# Patient Record
Sex: Male | Born: 1970 | Race: Black or African American | Hispanic: No | Marital: Single | Smoking: Current every day smoker
Health system: Southern US, Community
[De-identification: ages and names within clinical notes are randomized; demographics above are authoritative.]

## PROBLEM LIST (undated history)

## (undated) DIAGNOSIS — I428 Other cardiomyopathies: Secondary | ICD-10-CM

## (undated) DIAGNOSIS — I5022 Chronic systolic (congestive) heart failure: Secondary | ICD-10-CM

## (undated) DIAGNOSIS — I10 Essential (primary) hypertension: Secondary | ICD-10-CM

## (undated) DIAGNOSIS — G35 Multiple sclerosis: Secondary | ICD-10-CM

## (undated) HISTORY — DX: Other cardiomyopathies: I42.8

## (undated) HISTORY — PX: GALLBLADDER SURGERY: SHX652

## (undated) HISTORY — DX: Chronic systolic (congestive) heart failure: I50.22

---

## 2011-06-18 ENCOUNTER — Emergency Department (HOSPITAL_COMMUNITY): Payer: Self-pay

## 2011-06-18 ENCOUNTER — Encounter (HOSPITAL_COMMUNITY): Payer: Self-pay

## 2011-06-18 ENCOUNTER — Emergency Department (HOSPITAL_COMMUNITY)
Admission: EM | Admit: 2011-06-18 | Discharge: 2011-06-18 | Disposition: A | Discharge status: Home / Self Care | Payer: Self-pay | Attending: Emergency Medicine | Admitting: Emergency Medicine

## 2011-06-18 DIAGNOSIS — R51 Headache: Secondary | ICD-10-CM | POA: Insufficient documentation

## 2011-06-18 DIAGNOSIS — I10 Essential (primary) hypertension: Secondary | ICD-10-CM | POA: Insufficient documentation

## 2011-06-18 HISTORY — DX: Essential (primary) hypertension: I10

## 2011-06-18 LAB — DIFFERENTIAL
Basophils Absolute: 0 10*3/uL (ref 0.0–0.1)
Lymphocytes Relative: 14 % (ref 12–46)
Monocytes Absolute: 0.6 10*3/uL (ref 0.1–1.0)
Monocytes Relative: 5 % (ref 3–12)
Neutro Abs: 9.4 10*3/uL — ABNORMAL HIGH (ref 1.7–7.7)
Neutrophils Relative %: 81 % — ABNORMAL HIGH (ref 43–77)

## 2011-06-18 LAB — BASIC METABOLIC PANEL
BUN: 6 mg/dL (ref 6–23)
CO2: 27 mEq/L (ref 19–32)
Chloride: 98 mEq/L (ref 96–112)
Creatinine, Ser: 1.1 mg/dL (ref 0.50–1.35)
GFR calc Af Amer: >90 mL/min (ref >90)
Potassium: 3.6 mEq/L (ref 3.5–5.1)

## 2011-06-18 LAB — CBC
HCT: 46.2 % (ref 39.0–52.0)
Hemoglobin: 15.1 g/dL (ref 13.0–17.0)
WBC: 11.7 10*3/uL — ABNORMAL HIGH (ref 4.0–10.5)

## 2011-06-18 LAB — SEDIMENTATION RATE: Sed Rate: 1 mm/hr (ref 0–16)

## 2011-06-18 MED ORDER — DEXAMETHASONE SODIUM PHOSPHATE 10 MG/ML IJ SOLN
10.0000 mg | Freq: Once | INTRAMUSCULAR | Status: AC
  Administered 2011-06-18: 10 mg via INTRAVENOUS
  Filled 2011-06-18: qty 1

## 2011-06-18 MED ORDER — METOCLOPRAMIDE HCL 5 MG/ML IJ SOLN
10.0000 mg | Freq: Once | INTRAMUSCULAR | Status: AC
  Administered 2011-06-18: 10 mg via INTRAVENOUS
  Filled 2011-06-18: qty 2

## 2011-06-18 MED ORDER — KETOROLAC TROMETHAMINE 30 MG/ML IJ SOLN
30.0000 mg | Freq: Once | INTRAMUSCULAR | Status: AC
  Administered 2011-06-18: 30 mg via INTRAVENOUS
  Filled 2011-06-18: qty 1

## 2011-06-18 MED ORDER — FENTANYL CITRATE 0.05 MG/ML IJ SOLN
50.0000 ug | Freq: Once | INTRAMUSCULAR | Status: AC
  Administered 2011-06-18: 50 ug via INTRAVENOUS

## 2011-06-18 MED ORDER — SODIUM CHLORIDE 0.9 % IV SOLN
Freq: Once | INTRAVENOUS | Status: AC
  Administered 2011-06-18: 1000 mL via INTRAVENOUS

## 2011-06-18 MED ORDER — PROCHLORPERAZINE EDISYLATE 5 MG/ML IJ SOLN
10.0000 mg | Freq: Once | INTRAMUSCULAR | Status: AC
  Administered 2011-06-18: 10 mg via INTRAVENOUS
  Filled 2011-06-18: qty 2

## 2011-06-18 MED ORDER — DIPHENHYDRAMINE HCL 50 MG/ML IJ SOLN
12.5000 mg | Freq: Once | INTRAMUSCULAR | Status: AC
  Administered 2011-06-18: 12.5 mg via INTRAVENOUS
  Filled 2011-06-18: qty 1

## 2011-06-18 MED ORDER — HYDROCODONE-ACETAMINOPHEN 5-325 MG PO TABS: 1.0000 | ORAL_TABLET | Freq: Four times a day (QID) | ORAL | Status: DC | PRN

## 2011-06-18 MED ORDER — SODIUM CHLORIDE 0.9 % IV BOLUS (SEPSIS)
1000.0000 mL | Freq: Once | INTRAVENOUS | Status: AC
  Administered 2011-06-18: 1000 mL via INTRAVENOUS

## 2011-06-18 MED ORDER — FENTANYL CITRATE 0.05 MG/ML IJ SOLN
50.0000 ug | Freq: Once | INTRAMUSCULAR | Status: AC
  Administered 2011-06-18: 50 ug via INTRAVENOUS
  Filled 2011-06-18: qty 2

## 2011-06-18 MED ORDER — MAGNESIUM SULFATE 40 MG/ML IJ SOLN
2.0000 g | INTRAMUSCULAR | Status: AC
  Administered 2011-06-18: 2 g via INTRAVENOUS
  Filled 2011-06-18: qty 50

## 2011-06-18 MED ORDER — DICLOFENAC SODIUM 75 MG PO TBEC: 75.0000 mg | DELAYED_RELEASE_TABLET | Freq: Two times a day (BID) | ORAL | Status: AC

## 2011-06-18 MED ORDER — DIPHENHYDRAMINE HCL 50 MG/ML IJ SOLN
25.0000 mg | Freq: Once | INTRAMUSCULAR | Status: AC
  Administered 2011-06-18: 25 mg via INTRAVENOUS
  Filled 2011-06-18: qty 1

## 2011-06-18 NOTE — ED Notes (Signed)
Complains of pain in front of head.

## 2011-06-18 NOTE — ED Provider Notes (Signed)
History     CSN: [id number]  Arrival date & time 06/18/11  9562   First MD Initiated Contact with Patient 06/18/11 1013      11:00 AM  HPI Patient reports a headache that began yesterday. States headache has been gradually worsening. Reports pain is in the right temporal side and seems to radiate down entire right head. Describes pain as sharp and constant. Denies history of headaches or migraines in the past. Denies nausea, vomiting, weakness, aphasia, ataxia, neck pain, fever. Denies history of Zoster. Patient is a 41 y.o. male presenting with headaches. The history is provided by the patient.  Headache  This is a new problem. The current episode started yesterday. The problem occurs constantly. The problem has been gradually worsening. The pain is located in the right unilateral and temporal region. The quality of the pain is described as sharp. The pain is severe. The pain radiates to the face. Pertinent negatives include no fever, no shortness of breath, no nausea and no vomiting. He has tried nothing for the symptoms.    Past Medical History  Diagnosis Date  . Hypertension     History reviewed. No pertinent past surgical history.  History reviewed. No pertinent family history.  History  Substance Use Topics  . Smoking status: Never Smoker   . Smokeless tobacco: Not on file  . Alcohol Use: Yes      Review of Systems  Constitutional: Negative for fever, chills and diaphoresis.  HENT: Negative for ear pain, congestion, sore throat, rhinorrhea, neck pain, neck stiffness and sinus pressure.   Respiratory: Negative for shortness of breath.   Cardiovascular: Negative for chest pain.  Gastrointestinal: Negative for nausea and vomiting.  Neurological: Positive for headaches. Negative for dizziness, seizures, syncope, facial asymmetry, speech difficulty, weakness, light-headedness and numbness.  All other systems reviewed and are negative.    Allergies  Review of patient's  allergies indicates no known allergies.  Home Medications   Current Outpatient Rx  Name Route Sig Dispense Refill  . ACETAMINOPHEN 325 MG PO TABS Oral Take 650 mg by mouth every 6 (six) hours as needed. For pain.    . ASPIRIN 325 MG PO TABS Oral Take 325 mg by mouth daily.    Marland Kitchen HYDROCHLOROTHIAZIDE 12.5 MG PO CAPS Oral Take 12.5 mg by mouth daily.    Marland Kitchen LISINOPRIL 10 MG PO TABS Oral Take 10 mg by mouth daily.      BP 148/80  Pulse 70  Temp(Src) 99.3 F (37.4 C) (Oral)  Resp 20  SpO2 97%  Physical Exam  Constitutional: He is oriented to person, place, and time. He appears well-developed and well-nourished.  HENT:  Head: Normocephalic and atraumatic.  Right Ear: External ear normal.  Left Ear: External ear normal.  Nose: Nose normal.  Mouth/Throat: Oropharynx is clear and moist. No oropharyngeal exudate.  Eyes: Conjunctivae and EOM are normal. Pupils are equal, round, and reactive to light. Left eye exhibits no discharge.  Neck: Normal range of motion and full passive range of motion without pain. Neck supple. No spinous process tenderness and no muscular tenderness present. No rigidity. Normal range of motion present. No Brudzinski's sign and no Kernig's sign noted.  Cardiovascular: Normal rate, regular rhythm and normal heart sounds.   Pulmonary/Chest: Effort normal and breath sounds normal.  Abdominal: Soft. Bowel sounds are normal.  Lymphadenopathy:    He has no cervical adenopathy.  Neurological: He is alert and oriented to person, place, and time. He has normal strength.  No cranial nerve deficit. Coordination normal.  Skin: Skin is warm and dry. No rash noted. No erythema. No pallor.  Psychiatric: He has a normal mood and affect. His behavior is normal.    ED Course  Procedures   Results for orders placed during the hospital encounter of 06/18/11  CBC      Component Value Range   WBC 11.7 (*) 4.0 - 10.5 (K/uL)   RBC 5.68  4.22 - 5.81 (MIL/uL)   Hemoglobin 15.1  13.0 -  17.0 (g/dL)   HCT 16.1  09.6 - 04.5 (%)   MCV 81.3  78.0 - 100.0 (fL)   MCH 26.6  26.0 - 34.0 (pg)   MCHC 32.7  30.0 - 36.0 (g/dL)   RDW 40.9  81.1 - 91.4 (%)   Platelets 283  150 - 400 (K/uL)  DIFFERENTIAL      Component Value Range   Neutrophils Relative 81 (*) 43 - 77 (%)   Neutro Abs 9.4 (*) 1.7 - 7.7 (K/uL)   Lymphocytes Relative 14  12 - 46 (%)   Lymphs Abs 1.6  0.7 - 4.0 (K/uL)   Monocytes Relative 5  3 - 12 (%)   Monocytes Absolute 0.6  0.1 - 1.0 (K/uL)   Eosinophils Relative 0  0 - 5 (%)   Eosinophils Absolute 0.0  0.0 - 0.7 (K/uL)   Basophils Relative 0  0 - 1 (%)   Basophils Absolute 0.0  0.0 - 0.1 (K/uL)  BASIC METABOLIC PANEL      Component Value Range   Sodium 137  135 - 145 (mEq/L)   Potassium 3.6  3.5 - 5.1 (mEq/L)   Chloride 98  96 - 112 (mEq/L)   CO2 27  19 - 32 (mEq/L)   Glucose, Bld 83  70 - 99 (mg/dL)   BUN 6  6 - 23 (mg/dL)   Creatinine, Ser 7.82  0.50 - 1.35 (mg/dL)   Calcium 9.7  8.4 - 95.6 (mg/dL)   GFR calc non Af Amer 83 (*) >90 (mL/min)   GFR calc Af Amer >90  >90 (mL/min)  SEDIMENTATION RATE      Component Value Range   Sed Rate 1  0 - 16 (mm/hr)   Ct Head Wo Contrast  06/18/2011  *RADIOLOGY REPORT*  Clinical Data: Headache, dizziness.  CT HEAD WITHOUT CONTRAST  Technique:  Contiguous axial images were obtained from the base of the skull through the vertex without contrast.  Comparison: None  Findings: No acute intracranial abnormality.  Specifically, no hemorrhage, hydrocephalus, mass lesion, acute infarction, or significant intracranial injury.  No acute calvarial abnormality. Visualized paranasal sinuses and mastoids clear.  Orbital soft tissues unremarkable.  IMPRESSION: No intracranial abnormality.  Original Report Authenticated By: Cyndie Chime, M.D.     MDM   2:01 PM Labs and imaging WNL. Will give fentanyl for pain and d/c with vicodin and diclofenac. Advised immediate return to ED if he develops a facial rash, fever or neck pain.  Patient given description of zoster. Do not feel it is necessary to start on antivirals yet unless rash appears. Pt voices understanding and is ready for d/c    Thomasene Lot, PA-C 06/18/11 1404   As we were d/cing patient. Pt states HA is worsening. Is now a 7/10. Spouse states "he has a high pain tolerance. Advised will will try to control pain better in CDU.   Thomasene Lot, PA-C 06/18/11 1431

## 2011-06-18 NOTE — ED Notes (Signed)
PA at bedside.

## 2011-06-18 NOTE — ED Notes (Addendum)
Patient states he is continuing to have headache 8 of 10 when he his sitting up in bed.

## 2011-06-18 NOTE — ED Notes (Signed)
Patient continues to have headache. Patient states he has had a headache since 1230 pm yesterday. Lunch tray ordered and patient given a urinal.

## 2011-06-18 NOTE — ED Notes (Signed)
Patient states as long as he is lying down his headache pain is 1-2 of 10 and when he sits up his headache pain is 5 of 10.

## 2011-06-18 NOTE — Discharge Instructions (Signed)
General Headache, Without Cause A general headache has no specific cause. These headaches are not life-threatening. They will not lead to other types of headaches. HOME CARE   Make and keep follow-up visits with your doctor.   Only take medicine as told by your doctor.   Try to relax, get a massage, or use your thoughts to control your body (biofeedback).   Apply cold or heat to the head and neck. Apply 3 or 4 times a day or as needed.  Finding out the results of your test Ask when your test results will be ready. Make sure you get your test results. GET HELP RIGHT AWAY IF:   You have problems with medicine.   Your medicine does not help relieve pain.   Your headache changes or becomes worse.   You feel sick to your stomach (nauseous) or throw up (vomit).   You have a temperature by mouth above 102 F (38.9 C), not controlled by medicine.   Your have a stiff neck.   You have vision loss.   You have muscle weakness.   You lose control of your muscles.   You lose balance or have trouble walking.   You feel like you are going to pass out (faint).  MAKE SURE YOU:   Understand these instructions.   Will watch this condition.   Will get help right away if you are not doing well or get worse.  Document Released: 11/28/2007 Document Revised: 02/07/2011 Document Reviewed: 11/28/2007 [employer] Patient Information 2012 [employer]. 

## 2011-06-18 NOTE — ED Provider Notes (Signed)
5:00 PM Signout from [place], [place]. Pt presents with frontal HA. No neuro sx, CT and testing nl. Pt was treated with several pain medications and was feeling better. They were preparing to discharge the patient when he had recurrent headache. I've ordered additional cocktail of compazine, benadryl, and magnesium. No neuro deficits seen on exam. Heart with regular rate and rhythm, lungs clear to auscultation bilaterally , abdomen soft and nontender.  6:45 PM Patient is currently sleeping. His significant other, at the bedside states that he has been "in and out," but has stated that his headache has persisted. Will allow him to sleep and recheck later.  8:00 PM Patient stated he was feeling significantly better at this time. He feels stable to be discharged home. He was given the prescriptions which were written at his initial discharge. Return precautions discussed.  [place], [place] 06/19/11 [phone]

## 2011-06-19 NOTE — ED Provider Notes (Signed)
Medical screening examination/treatment/procedure(s) were performed by non-physician practitioner and as supervising physician I was immediately available for consultation/collaboration.   Lakeyshia Tuckerman, MD 06/19/11 0108 

## 2011-06-21 NOTE — ED Provider Notes (Signed)
Medical screening examination/treatment/procedure(s) were performed by non-physician practitioner and as supervising physician I was immediately available for consultation/collaboration.  Headache without neuro deficits or stiff neck  Donnetta Hutching, MD 06/21/11 1419

## 2012-01-16 ENCOUNTER — Emergency Department (HOSPITAL_COMMUNITY)
Admission: EM | Admit: 2012-01-16 | Discharge: 2012-01-16 | Disposition: A | Discharge status: Home / Self Care | Payer: Medicaid Other | Source: Home / Self Care | Attending: Emergency Medicine | Admitting: Emergency Medicine

## 2012-01-16 ENCOUNTER — Encounter (HOSPITAL_COMMUNITY): Payer: Self-pay | Admitting: Emergency Medicine

## 2012-01-16 DIAGNOSIS — I10 Essential (primary) hypertension: Secondary | ICD-10-CM

## 2012-01-16 DIAGNOSIS — G35 Multiple sclerosis: Secondary | ICD-10-CM

## 2012-01-16 DIAGNOSIS — K219 Gastro-esophageal reflux disease without esophagitis: Secondary | ICD-10-CM

## 2012-01-16 DIAGNOSIS — G629 Polyneuropathy, unspecified: Secondary | ICD-10-CM

## 2012-01-16 DIAGNOSIS — M549 Dorsalgia, unspecified: Secondary | ICD-10-CM

## 2012-01-16 HISTORY — DX: Multiple sclerosis: G35

## 2012-01-16 MED ORDER — LISINOPRIL 10 MG PO TABS: 10.0000 mg | ORAL_TABLET | Freq: Every day | ORAL | Status: DC

## 2012-01-16 MED ORDER — HYDROCHLOROTHIAZIDE 25 MG PO TABS: 25.0000 mg | ORAL_TABLET | Freq: Every day | ORAL | Status: DC

## 2012-01-16 MED ORDER — BACLOFEN 10 MG PO TABS: 10.0000 mg | ORAL_TABLET | Freq: Three times a day (TID) | ORAL | Status: DC

## 2012-01-16 MED ORDER — OXYCODONE-ACETAMINOPHEN 10-325 MG PO TABS: 1.0000 | ORAL_TABLET | ORAL | Status: DC | PRN

## 2012-01-16 MED ORDER — GABAPENTIN 300 MG PO CAPS: 300.0000 mg | ORAL_CAPSULE | Freq: Three times a day (TID) | ORAL | Status: DC

## 2012-01-16 NOTE — ED Notes (Signed)
Examined by dr Lorenz Coaster prior to this nurse

## 2012-01-16 NOTE — ED Provider Notes (Signed)
Chief Complaint  Patient presents with  . Back Pain    History of Present Illness:   [name] is a 41 year old male who presents today for refills on medications. He had symptoms since this past spring of muscle spasms in his legs, numbness in his feet and legs, pain in his lower back and both legs rated 7-8/10 in intensity and left arm numbness. He was hospitalized in May at [facility] in [place] and diagnosed with multiple sclerosis. He was then transferred to the [facility] for rehabilitation. He states he just moved here from [place]. He does not have a primary care physician and needs refills on multiple medications. For his multiple sclerosis he is on baclofen, Neurontin, and oxycodone. He states he takes 3 oxycodone 10/325 as per day. Review of the [place] reveals he got a prescription for 90 of the oxycodones about a month ago, so he is being faithful to his regimen. He also has high blood pressure and is on lisinopril and hydrochlorothiazide. He also has a diagnosis of gastroesophageal reflux but is not taking anything for that right now.  Review of Systems:  Other than noted above, the patient denies any of the following symptoms. Systemic:  No fever, chills, sweats, fatigue, myalgias, headache, or anorexia. Eye:  No redness, pain or drainage. ENT:  No earache, nasal congestion, rhinorrhea, sinus pressure, or sore throat. Lungs:  No cough, sputum production, wheezing, shortness of breath.  Cardiovascular:  No chest pain, palpitations, or syncope. GI:  No nausea, vomiting, abdominal pain or diarrhea. GU:  No dysuria, frequency, or hematuria. Skin:  No rash or pruritis.  PMFSH:  Past medical history, family history, social history, meds, and allergies were reviewed.   Physical Exam:   Vital signs:  BP 153/85  Pulse 98  Temp 99.8 F (37.7 C) (Oral)  Resp 18  SpO2 99% General:  Alert, in no distress. Eye:  PERRL, full EOMs.  Lids and conjunctivas were  normal. ENT:  TMs and canals were normal, without erythema or inflammation.  Nasal mucosa was clear and uncongested, without drainage.  Mucous membranes were moist.  Pharynx was clear, without exudate or drainage.  There were no oral ulcerations or lesions. Neck:  Supple, no adenopathy, tenderness or mass. Thyroid was normal. Lungs:  No respiratory distress.  Lungs were clear to auscultation, without wheezes, rales or rhonchi.  Breath sounds were clear and equal bilaterally. Heart:  Regular rhythm, without gallops, murmers or rubs. Abdomen:  Soft, flat, and non-tender to palpation.  No hepatosplenomagaly or mass. Neurological exam:  Neurological examination: The patient is alert and oriented x3. Speech is clear, fluent, and appropriate. Cranial nerves are intact. There is no pronator drift and finger to nose was normal. Muscle strength, sensation, and DTRs are normal. Babinskis are downgoing. Station and gait were normal. Romberg sign is negative, patient is able to perform tandem gait well. Skin:  Clear, warm, and dry, without rash or lesions.   Assessment:  The primary encounter diagnosis was Multiple sclerosis. Diagnoses of Back pain, Neuropathy, Hypertension, and GERD (gastroesophageal reflux disease) were also pertinent to this visit.  I explained to him that we did not refill prescriptions for chronic opioids, but I could give him a prescription for 20 which should last him for a week. Thereafter he will need to followup with a neurologist or a primary care physician. He was seeing [facility], but obviously they have closed, so he cannot go there. I did suggest  clinical be helping here next week as a possibility for followup.  Plan:   1.  The following meds were prescribed:   New Prescriptions   BACLOFEN (LIORESAL) 10 MG TABLET    Take 1 tablet (10 mg total) by mouth 3 (three) times daily.   GABAPENTIN (NEURONTIN) 300 MG CAPSULE    Take 1 capsule (300 mg total) by mouth  3 (three) times daily.   HYDROCHLOROTHIAZIDE (HYDRODIURIL) 25 MG TABLET    Take 1 tablet (25 mg total) by mouth daily.   LISINOPRIL (PRINIVIL,ZESTRIL) 10 MG TABLET    Take 1 tablet (10 mg total) by mouth daily.   OXYCODONE-ACETAMINOPHEN (PERCOCET) 10-325 MG PER TABLET    Take 1 tablet by mouth every 4 (four) hours as needed for pain.   2.  The patient was instructed in symptomatic care and handouts were given. 3.  The patient was told to return if becoming worse in any way, if no better in 3 or 4 days, and given some red flag symptoms that would indicate earlier return.  Follow up:  The patient was told to follow up with Dr. Porfirio Mylar Dohmeier or anyone in her practice as soon as possible.     Reuben Likes, MD 01/16/12 [phone]

## 2012-03-01 ENCOUNTER — Emergency Department (HOSPITAL_COMMUNITY)
Admission: EM | Admit: 2012-03-01 | Discharge: 2012-03-01 | Disposition: A | Discharge status: Home / Self Care | Payer: Medicaid Other | Source: Home / Self Care

## 2012-03-01 ENCOUNTER — Encounter (HOSPITAL_COMMUNITY): Payer: Self-pay | Admitting: Emergency Medicine

## 2012-03-01 DIAGNOSIS — I10 Essential (primary) hypertension: Secondary | ICD-10-CM

## 2012-03-01 DIAGNOSIS — G35 Multiple sclerosis: Secondary | ICD-10-CM

## 2012-03-01 MED ORDER — LISINOPRIL-HYDROCHLOROTHIAZIDE 10-12.5 MG PO TABS: 1.0000 | ORAL_TABLET | Freq: Every day | ORAL | Status: DC

## 2012-03-01 MED ORDER — OXYCODONE-ACETAMINOPHEN 10-325 MG PO TABS: 1.0000 | ORAL_TABLET | ORAL | Status: DC | PRN

## 2012-03-01 NOTE — ED Provider Notes (Signed)
History     CSN: [id number]  Arrival date & time 03/01/12  1559   None     Chief Complaint  Patient presents with  . Back Pain    back and leg pains due to MS. Still waiting referral for neurologist.     (Consider location/radiation/quality/duration/timing/severity/associated sxs/prior treatment) HPI Comments: 41 year old male presents with a request to have medication refills. He experienced symptoms of multiple sclerosis approximately 12 months ago and a few months later was diagnosed with that disease in [place] He never obtained a primary care provider and presented to the [facility] in November for medications related to muscle symptoms. These included weakness, pain and muscle spasms primarily lower extremities. He was seen by Dr. Lorenz Coaster who advised that we did not provide chronic medications particularly narcotic pain medications and was given the name of urologist to call for evaluation and management. There was some problem in obtaining the initial appointment but later established an appointment with [facility] neurology. During this time for waiting on the appointment he is completed or white out of his medications and is here for another refill. A last visit he was told that we would not be able to continue this and would need to establish with a physician would be willing to prescribe his medications. He denies that he has attempted to obtain a PCP and call that he could return here for refills. He denies any symptoms. He continues to have similar symptoms such as weakness, muscle spasms and pain in the lower extremities. He is ambulatory with full weightbearing.   Patient is a 41 y.o. male presenting with back pain.  Back Pain  Associated symptoms include weakness. Pertinent negatives include no fever.    Past Medical History  Diagnosis Date  . Hypertension   . MS (multiple sclerosis)     History reviewed. No pertinent past surgical  history.  History reviewed. No pertinent family history.  History  Substance Use Topics  . Smoking status: Current Every Day Smoker  . Smokeless tobacco: Not on file  . Alcohol Use: Yes      Review of Systems  Constitutional: Positive for activity change. Negative for fever.  HENT: Negative.   Respiratory: Negative.   Cardiovascular: Negative.   Gastrointestinal: Negative.   Musculoskeletal: Positive for myalgias and back pain.  Skin: Negative.   Neurological: Positive for dizziness and weakness. Negative for syncope and speech difficulty.    Allergies  Review of patient's allergies indicates no known allergies.  Home Medications   Current Outpatient Rx  Name  Route  Sig  Dispense  Refill  . ASPIRIN 325 MG PO TABS   Oral   Take 325 mg by mouth daily.         Marland Kitchen BACLOFEN 10 MG PO TABS   Oral   Take 1 tablet (10 mg total) by mouth 3 (three) times daily.   90 each   3   . DICLOFENAC SODIUM 75 MG PO TBEC   Oral   Take 1 tablet (75 mg total) by mouth 2 (two) times daily.   30 tablet   0   . GABAPENTIN 300 MG PO CAPS   Oral   Take 1 capsule (300 mg total) by mouth 3 (three) times daily.   90 capsule   3   . HYDROCHLOROTHIAZIDE 25 MG PO TABS   Oral   Take 1 tablet (25 mg total) by mouth daily.   30 tablet   3   .  LISINOPRIL 10 MG PO TABS   Oral   Take 10 mg by mouth daily.         . OXYCODONE-ACETAMINOPHEN 10-325 MG PO TABS   Oral   Take 1 tablet by mouth every 4 (four) hours as needed for pain.   20 tablet   0   . ACETAMINOPHEN 325 MG PO TABS   Oral   Take 650 mg by mouth every 6 (six) hours as needed. For pain.         Marland Kitchen GABAPENTIN 300 MG PO CAPS   Oral   Take 300 mg by mouth 3 (three) times daily.         Marland Kitchen HYDROCHLOROTHIAZIDE 12.5 MG PO CAPS   Oral   Take 12.5 mg by mouth daily.         Marland Kitchen LISINOPRIL 10 MG PO TABS   Oral   Take 1 tablet (10 mg total) by mouth daily.   30 tablet   3   . LISINOPRIL-HYDROCHLOROTHIAZIDE 10-12.5  MG PO TABS   Oral   Take 1 tablet by mouth daily.   30 tablet   0   . OXYCODONE-ACETAMINOPHEN 10-325 MG PO TABS   Oral   Take 1 tablet by mouth every 4 (four) hours as needed.         . OXYCODONE-ACETAMINOPHEN 10-325 MG PO TABS   Oral   Take 1 tablet by mouth every 4 (four) hours as needed for pain.   10 tablet   0     BP 140/100  Pulse 59  Temp 98.9 F (37.2 C) (Oral)  Resp 18  SpO2 99%  Physical Exam  Nursing note and vitals reviewed. Constitutional: He is oriented to person, place, and time. He appears well-developed and well-nourished. No distress.  Neck: Normal range of motion. Neck supple.  Cardiovascular: Normal rate, regular rhythm and normal heart sounds.   Pulmonary/Chest: Effort normal and breath sounds normal. No respiratory distress. He has no wheezes. He has no rales.  Abdominal: Soft. There is no tenderness.  Musculoskeletal: He exhibits no edema.  Neurological: He is alert and oriented to person, place, and time.  Skin: Skin is warm and dry.  Psychiatric: He has a normal mood and affect.    ED Course  Procedures (including critical care time)  Labs Reviewed - No data to display No results found.   1. Multiple sclerosis   2. HTN (hypertension)       MDM  After discussing this case with Dr. Lorenz Coaster I will be prescribing Percocet 10 mg #10 tablets and Zestoretic 10/12.5 mg to take every day for blood pressure. I have emphasized the importance of him calling to obtain a primary care provider for which she will need to treat his hypertension and other cold morbidities as well as acute illnesses that may, but the future. His physician would also be able to prescribe his medications. He is advised that we will not be able to prescribe any additional chronic medications particularly narcotic pain medicines. He is not in any acute distress and is stable during this visit.        Hayden Rasmussen, NP 03/01/12 [phone]

## 2012-03-01 NOTE — ED Notes (Signed)
Urine collected if needed. Mw,cma

## 2012-03-01 NOTE — ED Provider Notes (Signed)
Medical screening examination/treatment/procedure(s) were performed by non-physician practitioner and as supervising physician I was immediately available for consultation/collaboration.  Yamir Carignan, M.D.   Simonne Boulos C Alvena Kiernan, MD 03/01/12 2045 

## 2012-03-01 NOTE — ED Notes (Signed)
Pt states that he is having back and leg pains due to MS. Pt is still waiting referral for neurologist.   Denies any urinary symptoms or injury.

## 2012-07-24 ENCOUNTER — Telehealth: Payer: Self-pay | Admitting: Neurology

## 2012-07-24 NOTE — Telephone Encounter (Signed)
Letter done, please take care of it

## 2012-07-28 ENCOUNTER — Telehealth: Payer: Self-pay | Admitting: Neurology

## 2012-07-28 NOTE — Telephone Encounter (Signed)
I have talked with him, he needs letter stating his recent relapse, but I did not see him since Sep 2013, I will not be able to provide letter about severity of his most recent relapse.  He has missed his court appearance due to his recent MS relapse, need a letter for that, ask for pain medications in the past.  Chart reviewed, not compliance with his ms follow up in the past.   Annabelle Harman, please call him for a follow up appt. If we fail to reach him, will discharge him from our clinic.

## 2012-07-29 ENCOUNTER — Telehealth: Payer: Self-pay | Admitting: Neurology

## 2012-07-30 NOTE — Telephone Encounter (Signed)
Spoke to patient and he has apt. Sch. With Dr.Yan on Tues. June 3rd.

## 2012-07-30 NOTE — Telephone Encounter (Signed)
Called and left messages. Will wait a few more days for a call back

## 2012-07-30 NOTE — Telephone Encounter (Signed)
Spoke to patient and his Jone Baseman [phone]  Annette Stable gave me updated.  For patient. Patient is sch. For June 3rd wih Dr.Yan.

## 2012-08-04 ENCOUNTER — Ambulatory Visit (INDEPENDENT_AMBULATORY_CARE_PROVIDER_SITE_OTHER): Payer: Medicaid Other | Admitting: Neurology

## 2012-08-04 ENCOUNTER — Encounter: Payer: Self-pay | Admitting: Neurology

## 2012-08-04 VITALS — BP 130/80 | HR 62 | Ht 70.0 in | Wt 255.0 lb

## 2012-08-04 DIAGNOSIS — G35 Multiple sclerosis: Secondary | ICD-10-CM

## 2012-08-04 DIAGNOSIS — I10 Essential (primary) hypertension: Secondary | ICD-10-CM

## 2012-08-04 DIAGNOSIS — I119 Hypertensive heart disease without heart failure: Secondary | ICD-10-CM | POA: Insufficient documentation

## 2012-08-04 MED ORDER — GABAPENTIN 300 MG PO CAPS: 300.0000 mg | ORAL_CAPSULE | Freq: Three times a day (TID) | ORAL | Status: DC

## 2012-08-04 NOTE — Progress Notes (Signed)
History of Present Illness   [name] is a 42 years old right-handed African American male, referred by Dr. Caswell Corwin for evaluation of multiple sclerosis, his primary care physician is from [facility], last visit was Sep 2013.  He presented to emergency room at [facility] in 05/31st/2013, with acute worsening of bilateral lower extremity weakness, to the point of difficulty driving, bearing weight. Preceding that, he noticed numbness tingling in both feet, ascending quickly to waist level in April 2013, he denies incontinence.  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  Laboratory normal CMP, with exception of glucose 100, elevated WBC, otherwise normal CBC,  MRI was done at [facility] in May 2013, MRI lumbar, mild degenerative disc disease, MRI thoracic spine 2 lesions in lower thoracic spinal cord and conus medullaris, T12-L1, slight expansion of the spinal cord at this to level, MRI of brain singling enhancing lesions in the left medulla. MRI cervical no cord lesion, EKG showed sinus bradycardia,   UPDATE June 3rd, 2014: He was screened for possible MS PREFER trial, but he was found to have bradycardia, not a candidate, there was multiple phone call from patient requesting Percocet, but he has lost to followup until Jul 24 2012, he is requesting a letter for his attorney, to explain his absent from court  appearance.  He reported a relapses in May 12th 2014, he noticed numbness, tingling at feet, muscle spasm of both legs, leg jerking, over next 4-5 days course, he had progressive bilateral lower extremity weakness, with associated bilateral lower extremity muscle achy pain."legs like rubber", he could not get out bed, which lasted from May 15th -May 21st. He need assistant to get out of bed, but he was not evaluated by medical profession at that time.  He also described incontinence then, he had bowel accident, several episodes in one day, he also has bladder urgency.   His symptoms has gradually improved now without any treatment, He is now able to walk with cane intermittently, still has balance difficulty,   He also complains of fatigue. His bowel and bladder issue has resolved. He has mild residual left leg weakness,  He is not driving, his fianc drove him to office today, but not present during the interview.  He has run out of all his medication in the past 3- 4 months, lost follow up with primary care physician  Review of Systems  Out of a complete 14 system review, the patient complains of only the following symptoms, and all other reviewed systems are negative.   Constitutional:   N/A Cardiovascular:  N/A Ear/Nose/Throat:  N/A Skin: N/A Eyes: N/A Respiratory: N/A Gastroitestinal: N/A    Hematology/Lymphatic:  N/A Endocrine:  N/A Musculoskeletal: joint pain Allergy/Immunology: allergy, runny nose Neurological: confusion, headaches, numbness, weakness, dizziness, insomnia, snoring, restless legs. Psychiatric:    Depressed, anxiety, decreased energy   Physical Exam  Neck: supple no carotid bruits Respiratory: clear to auscultation bilaterally Cardiovascular: regular  rate rhythm  Neurologic Exam  Mental Status: pleasant, awake, alert, cooperative to history, talking, and casual conversation. Cranial Nerves: CN II-XII pupils were equal round reactive to light.  Fundi were  sharp bilaterally.  Extraocular movements were full.  Visual fields were full on confrontational test.  Facial sensation and strength were normal.  Hearing was intact to finger rubbing bilaterally.  Uvula tongue were midline.  Head turning and shoulder shrugging were normal and symmetric.  Tongue protrusion into the cheeks strength were normal.  Motor: mild bilateral lower extremity spasticity, mild bilateral hip flexion weakness. Sensory: sensory level at above umbilical level T8-9 Coordination: Normal finger-to-nose, heel-to-shin.  There was no dysmetria noticed. Gait and Station: Narrow based and stiff, cautious gait.  Romberg sign: Negative Reflexes: Deep tendon reflexes: Biceps: 2/2, Brachioradialis: 2/2, Triceps: 2/2, Pateller: 1/1, Achilles: 2/2.  Plantar responses are flexor.   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 complete evaluation with MRI of brain, cervical, thoracic spine without contrast 2 .Laboratory evaluation to rule out mimics 3. RTC in 2 months

## 2012-08-14 ENCOUNTER — Other Ambulatory Visit: Payer: Medicaid Other

## 2012-08-14 ENCOUNTER — Inpatient Hospital Stay
Admission: RE | Admit: 2012-08-14 | Discharge: 2012-08-14 | Disposition: A | Discharge status: Home / Self Care | Payer: Medicaid Other | Source: Ambulatory Visit | Attending: Neurology | Admitting: Neurology

## 2012-10-22 ENCOUNTER — Ambulatory Visit: Payer: Medicaid Other | Admitting: Neurology

## 2014-01-10 ENCOUNTER — Encounter (HOSPITAL_COMMUNITY): Payer: Self-pay

## 2014-01-10 ENCOUNTER — Emergency Department (INDEPENDENT_AMBULATORY_CARE_PROVIDER_SITE_OTHER)
Admission: EM | Admit: 2014-01-10 | Discharge: 2014-01-10 | Disposition: A | Discharge status: Home / Self Care | Payer: Medicaid Other | Source: Home / Self Care | Attending: Family Medicine | Admitting: Family Medicine

## 2014-01-10 DIAGNOSIS — R300 Dysuria: Secondary | ICD-10-CM

## 2014-01-10 DIAGNOSIS — I10 Essential (primary) hypertension: Secondary | ICD-10-CM

## 2014-01-10 LAB — POCT I-STAT, CHEM 8
BUN: 12 mg/dL (ref 6–23)
CREATININE: 1.3 mg/dL (ref 0.50–1.35)
Calcium, Ion: 1.24 mmol/L — ABNORMAL HIGH (ref 1.12–1.23)
Chloride: 102 mEq/L (ref 96–112)
Glucose, Bld: 78 mg/dL (ref 70–99)
HCT: 54 % — ABNORMAL HIGH (ref 39.0–52.0)
HEMOGLOBIN: 18.4 g/dL — AB (ref 13.0–17.0)
Potassium: 3.8 mEq/L (ref 3.7–5.3)
Sodium: 141 mEq/L (ref 137–147)
TCO2: 31 mmol/L (ref 0–100)

## 2014-01-10 MED ORDER — HYDROCHLOROTHIAZIDE 12.5 MG PO TABS: 12.5000 mg | ORAL_TABLET | Freq: Every day | ORAL | Status: DC

## 2014-01-10 MED ORDER — LISINOPRIL 20 MG PO TABS: 20.0000 mg | ORAL_TABLET | Freq: Every day | ORAL | Status: DC

## 2014-01-10 NOTE — ED Notes (Signed)
Multiple issues. Reported history of MS, has a tendency to have frequent UTI's, and his MD is no longer in practice at [facility] in [place]. Out of his BP medication > 2 weeks, out of his preventative UTI medication. Denies any STD concerns, but has c/o of discomfort w urination

## 2014-01-10 NOTE — ED Provider Notes (Signed)
CSN: [id number]     Arrival date & time 01/10/14  1148 History   First MD Initiated Contact with Patient 01/10/14 1238     Chief Complaint  Patient presents with  . Medication Refill   (Consider location/radiation/quality/duration/timing/severity/associated sxs/prior Treatment) HPI Comments: Patient presents with several requests. First he states that he has run out of his anti-htn medication and that he has been discharged from his previous PCP ([facility]) and has not yet found new PCP. Therefore, he requests medication refill. Next he reports 3 days of dysuria without associated hematuria or penile discharge. Denies fever, flank pain or lesions on genitals. Denies concerns regarding STIs and does not wish to be tested for STIs. Lastly, he states he has also run out of his medications used for symptoms caused by his MS (gabapentin and baclofen) and asks that we refill these medications as well. Patient does have neurologist who is active in his care (Dr. Terrace Myers at [facility])  The history is provided by the patient.    Past Medical History  Diagnosis Date  . Hypertension   . MS (multiple sclerosis)    Past Surgical History  Procedure Laterality Date  . Gallbladder surgery      2009   Family History  Problem Relation Age of Onset  . High blood pressure Mother   . High blood pressure Father    History  Substance Use Topics  . Smoking status: Current Every Day Smoker    Types: Cigarettes  . Smokeless tobacco: Never Used  . Alcohol Use: 0.0 oz/week     Comment: Social    Review of Systems  Constitutional: Negative.   HENT: Negative.   Eyes: Negative.   Respiratory: Negative.   Cardiovascular: Negative.   Gastrointestinal: Negative.   Endocrine: Negative for polydipsia, polyphagia and polyuria.  Genitourinary: Positive for dysuria. Negative for urgency, frequency, hematuria, flank pain, discharge, penile swelling, scrotal swelling, genital sores, penile pain and testicular  pain.  Musculoskeletal: Positive for myalgias. Negative for back pain, arthralgias, gait problem and neck stiffness.  Skin: Negative.   Neurological: Positive for numbness. Negative for dizziness, tremors, syncope, speech difficulty, weakness, light-headedness and headaches.    Allergies  Review of patient's allergies indicates no known allergies.  Home Medications   Prior to Admission medications   Medication Sig Start Date End Date Taking? Authorizing Provider  baclofen (LIORESAL) 20 MG tablet Take 20 mg by mouth 3 (three) times daily.   Yes Historical Provider, MD  diclofenac (VOLTAREN) 75 MG EC tablet Take 75 mg by mouth 2 (two) times daily.   Yes Historical Provider, MD  gabapentin (NEURONTIN) 300 MG capsule Take 1 capsule (300 mg total) by mouth 3 (three) times daily. 08/04/12   Levert Feinstein, MD  hydrochlorothiazide (HYDRODIURIL) 12.5 MG tablet Take 1 tablet (12.5 mg total) by mouth daily. 01/10/14   Mathis Fare Presson, PA  lisinopril (PRINIVIL,ZESTRIL) 20 MG tablet Take 1 tablet (20 mg total) by mouth daily. 01/10/14   Jennifer Lee H Presson, PA   BP 178/101 mmHg  Pulse 72  Temp(Src) 98.7 F (37.1 C) (Oral)  Resp 16  SpO2 100% Physical Exam  Constitutional: He is oriented to person, place, and time. He appears well-developed and well-nourished. No distress.  HENT:  Head: Normocephalic and atraumatic.  Eyes: Conjunctivae are normal. No scleral icterus.  Neck: Normal range of motion. Neck supple.  Cardiovascular: Normal rate, regular rhythm and normal heart sounds.   Pulmonary/Chest: Effort normal and breath sounds normal.  Musculoskeletal: Normal range of motion.  Neurological: He is alert and oriented to person, place, and time.  Skin: Skin is warm and dry.  Psychiatric: He has a normal mood and affect. His behavior is normal.  Nursing note and vitals reviewed.   ED Course  Procedures (including critical care time) Labs Review Labs Reviewed - No data to  display  Imaging Review No results found.   MDM   1. Dysuria   2. Essential hypertension    States he is unable to produce a urine specimen here and requests that he be able to collect at home and return with specimen when produced. IStat-8: Provided patient with limited Rx for his lisinopril/HCTZ and advised PCP follow up for continued management.  Advised patient that management of his MS is beyond our scope of practice and I asked him to contact his neurologist with regard to management and MS related medication refills.    Ria Clock, [place] 01/10/14 [phone]

## 2014-01-10 NOTE — Discharge Instructions (Signed)
Please feel free to return with your urine specimen when available. Specimen must be refrigerated if stored overnight.  Hypertension Hypertension, commonly called high blood pressure, is when the force of blood pumping through your arteries is too strong. Your arteries are the blood vessels that carry blood from your heart throughout your body. A blood pressure reading consists of a higher number over a lower number, such as 110/72. The higher number (systolic) is the pressure inside your arteries when your heart pumps. The lower number (diastolic) is the pressure inside your arteries when your heart relaxes. Ideally you want your blood pressure below 120/80. Hypertension forces your heart to work harder to pump blood. Your arteries may become narrow or stiff. Having hypertension puts you at risk for heart disease, stroke, and other problems.  RISK FACTORS Some risk factors for high blood pressure are controllable. Others are not.  Risk factors you cannot control include:   Race. You may be at higher risk if you are African American.  Age. Risk increases with age.  Gender. Men are at higher risk than women before age 88 years. After age 26, women are at higher risk than men. Risk factors you can control include:  Not getting enough exercise or physical activity.  Being overweight.  Getting too much fat, sugar, calories, or salt in your diet.  Drinking too much alcohol. SIGNS AND SYMPTOMS Hypertension does not usually cause signs or symptoms. Extremely high blood pressure (hypertensive crisis) may cause headache, anxiety, shortness of breath, and nosebleed. DIAGNOSIS  To check if you have hypertension, your health care provider will measure your blood pressure while you are seated, with your arm held at the level of your heart. It should be measured at least twice using the same arm. Certain conditions can cause a difference in blood pressure between your right and left arms. A blood pressure  reading that is higher than normal on one occasion does not mean that you need treatment. If one blood pressure reading is high, ask your health care provider about having it checked again. TREATMENT  Treating high blood pressure includes making lifestyle changes and possibly taking medicine. Living a healthy lifestyle can help lower high blood pressure. You may need to change some of your habits. Lifestyle changes may include:  Following the DASH diet. This diet is high in fruits, vegetables, and whole grains. It is low in salt, red meat, and added sugars.  Getting at least 2 hours of brisk physical activity every week.  Losing weight if necessary.  Not smoking.  Limiting alcoholic beverages.  Learning ways to reduce stress. If lifestyle changes are not enough to get your blood pressure under control, your health care provider may prescribe medicine. You may need to take more than one. Work closely with your health care provider to understand the risks and benefits. HOME CARE INSTRUCTIONS  Have your blood pressure rechecked as directed by your health care provider.   Take medicines only as directed by your health care provider. Follow the directions carefully. Blood pressure medicines must be taken as prescribed. The medicine does not work as well when you skip doses. Skipping doses also puts you at risk for problems.   Do not smoke.   Monitor your blood pressure at home as directed by your health care provider. SEEK MEDICAL CARE IF:   You think you are having a reaction to medicines taken.  You have recurrent headaches or feel dizzy.  You have swelling in your ankles.  You have trouble with your vision. SEEK IMMEDIATE MEDICAL CARE IF:  You develop a severe headache or confusion.  You have unusual weakness, numbness, or feel faint.  You have severe chest or abdominal pain.  You vomit repeatedly.  You have trouble breathing. MAKE SURE YOU:   Understand these  instructions.  Will watch your condition.  Will get help right away if you are not doing well or get worse. Document Released: 02/18/2005 Document Revised: 07/05/2013 Document Reviewed: 12/11/2012 [facility] Patient Information 2015 [place], [place]. This information is not intended to replace advice given to you by your health care provider. Make sure you discuss any questions you have with your health care provider.

## 2015-05-29 ENCOUNTER — Emergency Department (HOSPITAL_COMMUNITY): Payer: Medicaid Other

## 2015-05-29 ENCOUNTER — Encounter (HOSPITAL_COMMUNITY): Payer: Self-pay | Admitting: *Deleted

## 2015-05-29 DIAGNOSIS — I248 Other forms of acute ischemic heart disease: Secondary | ICD-10-CM | POA: Diagnosis present

## 2015-05-29 DIAGNOSIS — Z79899 Other long term (current) drug therapy: Secondary | ICD-10-CM

## 2015-05-29 DIAGNOSIS — I130 Hypertensive heart and chronic kidney disease with heart failure and stage 1 through stage 4 chronic kidney disease, or unspecified chronic kidney disease: Principal | ICD-10-CM | POA: Diagnosis present

## 2015-05-29 DIAGNOSIS — F17210 Nicotine dependence, cigarettes, uncomplicated: Secondary | ICD-10-CM | POA: Diagnosis present

## 2015-05-29 DIAGNOSIS — G35 Multiple sclerosis: Secondary | ICD-10-CM | POA: Diagnosis present

## 2015-05-29 DIAGNOSIS — I5043 Acute on chronic combined systolic (congestive) and diastolic (congestive) heart failure: Secondary | ICD-10-CM | POA: Diagnosis present

## 2015-05-29 DIAGNOSIS — I420 Dilated cardiomyopathy: Secondary | ICD-10-CM | POA: Diagnosis present

## 2015-05-29 DIAGNOSIS — N183 Chronic kidney disease, stage 3 (moderate): Secondary | ICD-10-CM | POA: Diagnosis present

## 2015-05-29 DIAGNOSIS — I160 Hypertensive urgency: Secondary | ICD-10-CM | POA: Diagnosis present

## 2015-05-29 DIAGNOSIS — Z9114 Patient's other noncompliance with medication regimen: Secondary | ICD-10-CM

## 2015-05-29 LAB — COMPREHENSIVE METABOLIC PANEL
ALK PHOS: 42 U/L (ref 38–126)
ALT: 43 U/L (ref 17–63)
AST: 26 U/L (ref 15–41)
Albumin: 3.5 g/dL (ref 3.5–5.0)
Anion gap: 8 (ref 5–15)
BUN: 17 mg/dL (ref 6–20)
CALCIUM: 9 mg/dL (ref 8.9–10.3)
CO2: 26 mmol/L (ref 22–32)
CREATININE: 1.59 mg/dL — AB (ref 0.61–1.24)
Chloride: 106 mmol/L (ref 101–111)
GFR calc Af Amer: 60 mL/min — ABNORMAL LOW (ref >60)
GFR calc non Af Amer: 52 mL/min — ABNORMAL LOW (ref >60)
Glucose, Bld: 106 mg/dL — ABNORMAL HIGH (ref 65–99)
Potassium: 3.6 mmol/L (ref 3.5–5.1)
SODIUM: 140 mmol/L (ref 135–145)
Total Bilirubin: 0.8 mg/dL (ref 0.3–1.2)
Total Protein: 6.4 g/dL — ABNORMAL LOW (ref 6.5–8.1)

## 2015-05-29 LAB — CBC WITH DIFFERENTIAL/PLATELET
BASOS ABS: 0 10*3/uL (ref 0.0–0.1)
BASOS PCT: 0 %
EOS ABS: 0.2 10*3/uL (ref 0.0–0.7)
Eosinophils Relative: 3 %
HCT: 41.1 % (ref 39.0–52.0)
HEMOGLOBIN: 13 g/dL (ref 13.0–17.0)
Lymphocytes Relative: 19 %
Lymphs Abs: 1.8 10*3/uL (ref 0.7–4.0)
MCH: 26.1 pg (ref 26.0–34.0)
MCHC: 31.6 g/dL (ref 30.0–36.0)
MCV: 82.4 fL (ref 78.0–100.0)
Monocytes Absolute: 0.8 10*3/uL (ref 0.1–1.0)
Monocytes Relative: 9 %
Neutro Abs: 6.5 10*3/uL (ref 1.7–7.7)
Neutrophils Relative %: 69 %
Platelets: 218 10*3/uL (ref 150–400)
RBC: 4.99 MIL/uL (ref 4.22–5.81)
RDW: 14.5 % (ref 11.5–15.5)
WBC: 9.3 10*3/uL (ref 4.0–10.5)

## 2015-05-29 LAB — I-STAT TROPONIN, ED: TROPONIN I, POC: 0.14 ng/mL — AB (ref 0.00–0.08)

## 2015-05-29 LAB — BRAIN NATRIURETIC PEPTIDE: B Natriuretic Peptide: 1013.4 pg/mL — ABNORMAL HIGH (ref 0.0–100.0)

## 2015-05-29 MED ORDER — ALBUTEROL SULFATE (2.5 MG/3ML) 0.083% IN NEBU: 5.0000 mg | INHALATION_SOLUTION | Freq: Once | RESPIRATORY_TRACT | Status: DC

## 2015-05-29 NOTE — ED Notes (Signed)
Pt states he woke up with bilateral leg swelling and shortness of breath. Pt denies CP, n/v, dizziness. Pt does not perform any job that requires a lot of standing. Pt was hx with MS a few years ago. Pt reports intermittent shooting pain in lower legs. No pain at this time.

## 2015-05-30 ENCOUNTER — Inpatient Hospital Stay (HOSPITAL_COMMUNITY): Payer: Medicaid Other

## 2015-05-30 ENCOUNTER — Inpatient Hospital Stay (HOSPITAL_COMMUNITY)
Admission: EM | Admit: 2015-05-30 | Discharge: 2015-06-02 | DRG: 286 | Disposition: A | Discharge status: Home / Self Care | Payer: Medicaid Other | Attending: Internal Medicine | Admitting: Internal Medicine

## 2015-05-30 ENCOUNTER — Encounter (HOSPITAL_COMMUNITY): Payer: Self-pay | Admitting: Internal Medicine

## 2015-05-30 DIAGNOSIS — N183 Chronic kidney disease, stage 3 unspecified: Secondary | ICD-10-CM | POA: Diagnosis present

## 2015-05-30 DIAGNOSIS — I428 Other cardiomyopathies: Secondary | ICD-10-CM | POA: Clinically undetermined

## 2015-05-30 DIAGNOSIS — R0600 Dyspnea, unspecified: Secondary | ICD-10-CM | POA: Diagnosis not present

## 2015-05-30 DIAGNOSIS — I509 Heart failure, unspecified: Secondary | ICD-10-CM | POA: Diagnosis present

## 2015-05-30 DIAGNOSIS — I5043 Acute on chronic combined systolic (congestive) and diastolic (congestive) heart failure: Secondary | ICD-10-CM | POA: Diagnosis not present

## 2015-05-30 DIAGNOSIS — I160 Hypertensive urgency: Secondary | ICD-10-CM

## 2015-05-30 DIAGNOSIS — F17210 Nicotine dependence, cigarettes, uncomplicated: Secondary | ICD-10-CM | POA: Diagnosis present

## 2015-05-30 DIAGNOSIS — G35D Multiple sclerosis, unspecified: Secondary | ICD-10-CM | POA: Diagnosis present

## 2015-05-30 DIAGNOSIS — R7989 Other specified abnormal findings of blood chemistry: Secondary | ICD-10-CM | POA: Diagnosis not present

## 2015-05-30 DIAGNOSIS — Z79899 Other long term (current) drug therapy: Secondary | ICD-10-CM | POA: Diagnosis not present

## 2015-05-30 DIAGNOSIS — I420 Dilated cardiomyopathy: Secondary | ICD-10-CM | POA: Diagnosis not present

## 2015-05-30 DIAGNOSIS — I5021 Acute systolic (congestive) heart failure: Secondary | ICD-10-CM | POA: Diagnosis not present

## 2015-05-30 DIAGNOSIS — I248 Other forms of acute ischemic heart disease: Secondary | ICD-10-CM | POA: Diagnosis present

## 2015-05-30 DIAGNOSIS — G35 Multiple sclerosis: Secondary | ICD-10-CM | POA: Diagnosis present

## 2015-05-30 DIAGNOSIS — R609 Edema, unspecified: Secondary | ICD-10-CM

## 2015-05-30 DIAGNOSIS — Z9114 Patient's other noncompliance with medication regimen: Secondary | ICD-10-CM | POA: Diagnosis present

## 2015-05-30 DIAGNOSIS — I5022 Chronic systolic (congestive) heart failure: Secondary | ICD-10-CM

## 2015-05-30 DIAGNOSIS — Z91148 Patient's other noncompliance with medication regimen for other reason: Secondary | ICD-10-CM | POA: Diagnosis present

## 2015-05-30 DIAGNOSIS — R778 Other specified abnormalities of plasma proteins: Secondary | ICD-10-CM | POA: Diagnosis present

## 2015-05-30 DIAGNOSIS — I429 Cardiomyopathy, unspecified: Secondary | ICD-10-CM | POA: Diagnosis not present

## 2015-05-30 DIAGNOSIS — I130 Hypertensive heart and chronic kidney disease with heart failure and stage 1 through stage 4 chronic kidney disease, or unspecified chronic kidney disease: Secondary | ICD-10-CM | POA: Diagnosis present

## 2015-05-30 LAB — COMPREHENSIVE METABOLIC PANEL
ALBUMIN: 3.8 g/dL (ref 3.5–5.0)
ALT: 46 U/L (ref 17–63)
AST: 29 U/L (ref 15–41)
Alkaline Phosphatase: 49 U/L (ref 38–126)
Anion gap: 7 (ref 5–15)
BILIRUBIN TOTAL: 0.7 mg/dL (ref 0.3–1.2)
BUN: 17 mg/dL (ref 6–20)
CALCIUM: 9.2 mg/dL (ref 8.9–10.3)
CHLORIDE: 102 mmol/L (ref 101–111)
CO2: 34 mmol/L — ABNORMAL HIGH (ref 22–32)
Creatinine, Ser: 1.66 mg/dL — ABNORMAL HIGH (ref 0.61–1.24)
GFR calc Af Amer: 57 mL/min — ABNORMAL LOW (ref >60)
GFR calc non Af Amer: 49 mL/min — ABNORMAL LOW (ref >60)
GLUCOSE: 83 mg/dL (ref 65–99)
POTASSIUM: 3.6 mmol/L (ref 3.5–5.1)
Sodium: 143 mmol/L (ref 135–145)
Total Protein: 6.8 g/dL (ref 6.5–8.1)

## 2015-05-30 LAB — CBC WITH DIFFERENTIAL/PLATELET
Basophils Absolute: 0 10*3/uL (ref 0.0–0.1)
Basophils Relative: 0 %
EOS ABS: 0.3 10*3/uL (ref 0.0–0.7)
EOS PCT: 3 %
HCT: 44.9 % (ref 39.0–52.0)
HEMOGLOBIN: 14.2 g/dL (ref 13.0–17.0)
LYMPHS ABS: 2.3 10*3/uL (ref 0.7–4.0)
Lymphocytes Relative: 23 %
MCH: 26.1 pg (ref 26.0–34.0)
MCHC: 31.6 g/dL (ref 30.0–36.0)
MCV: 82.5 fL (ref 78.0–100.0)
MONO ABS: 0.6 10*3/uL (ref 0.1–1.0)
MONOS PCT: 6 %
NEUTROS PCT: 68 %
Neutro Abs: 6.6 10*3/uL (ref 1.7–7.7)
Platelets: 216 10*3/uL (ref 150–400)
RBC: 5.44 MIL/uL (ref 4.22–5.81)
RDW: 14.8 % (ref 11.5–15.5)
WBC: 9.8 10*3/uL (ref 4.0–10.5)

## 2015-05-30 LAB — TROPONIN I
TROPONIN I: 0.11 ng/mL — AB (ref <0.031)
Troponin I: 0.12 ng/mL — ABNORMAL HIGH (ref <0.031)
Troponin I: 0.11 ng/mL — ABNORMAL HIGH (ref <0.031)

## 2015-05-30 LAB — TSH: TSH: 3.923 u[IU]/mL (ref 0.350–4.500)

## 2015-05-30 LAB — MAGNESIUM: MAGNESIUM: 2 mg/dL (ref 1.7–2.4)

## 2015-05-30 MED ORDER — LISINOPRIL-HYDROCHLOROTHIAZIDE 10-12.5 MG PO TABS: 1.0000 | ORAL_TABLET | Freq: Every day | ORAL | Status: DC

## 2015-05-30 MED ORDER — FUROSEMIDE 10 MG/ML IJ SOLN
40.0000 mg | Freq: Once | INTRAMUSCULAR | Status: AC
  Administered 2015-05-30: 40 mg via INTRAVENOUS
  Filled 2015-05-30: qty 4

## 2015-05-30 MED ORDER — ENOXAPARIN SODIUM 60 MG/0.6ML ~~LOC~~ SOLN
60.0000 mg | SUBCUTANEOUS | Status: DC
  Administered 2015-05-30: 60 mg via SUBCUTANEOUS
  Filled 2015-05-30 (×2): qty 0.6

## 2015-05-30 MED ORDER — METOPROLOL TARTRATE 12.5 MG HALF TABLET
12.5000 mg | ORAL_TABLET | Freq: Two times a day (BID) | ORAL | Status: DC
  Administered 2015-05-30 – 2015-05-31 (×3): 12.5 mg via ORAL
  Filled 2015-05-30 (×3): qty 1

## 2015-05-30 MED ORDER — ONDANSETRON HCL 4 MG PO TABS: 4.0000 mg | ORAL_TABLET | Freq: Four times a day (QID) | ORAL | Status: DC | PRN

## 2015-05-30 MED ORDER — ONDANSETRON HCL 4 MG/2ML IJ SOLN: 4.0000 mg | Freq: Four times a day (QID) | INTRAMUSCULAR | Status: DC | PRN

## 2015-05-30 MED ORDER — ACETAMINOPHEN 325 MG PO TABS: 650.0000 mg | ORAL_TABLET | Freq: Four times a day (QID) | ORAL | Status: DC | PRN

## 2015-05-30 MED ORDER — HYDROCHLOROTHIAZIDE 12.5 MG PO CAPS
12.5000 mg | ORAL_CAPSULE | Freq: Every day | ORAL | Status: DC
  Administered 2015-05-30: 12.5 mg via ORAL
  Filled 2015-05-30: qty 1

## 2015-05-30 MED ORDER — HYDRALAZINE HCL 20 MG/ML IJ SOLN: 10.0000 mg | INTRAMUSCULAR | Status: DC | PRN

## 2015-05-30 MED ORDER — LISINOPRIL 10 MG PO TABS
10.0000 mg | ORAL_TABLET | Freq: Every day | ORAL | Status: DC
  Administered 2015-05-30 – 2015-05-31 (×2): 10 mg via ORAL
  Filled 2015-05-30 (×2): qty 1

## 2015-05-30 MED ORDER — FUROSEMIDE 10 MG/ML IJ SOLN
40.0000 mg | Freq: Two times a day (BID) | INTRAMUSCULAR | Status: DC
  Administered 2015-05-30 – 2015-05-31 (×3): 40 mg via INTRAVENOUS
  Filled 2015-05-30 (×3): qty 4

## 2015-05-30 MED ORDER — POTASSIUM CHLORIDE CRYS ER 10 MEQ PO TBCR
10.0000 meq | EXTENDED_RELEASE_TABLET | Freq: Every day | ORAL | Status: DC
  Administered 2015-05-30 – 2015-06-02 (×4): 10 meq via ORAL
  Filled 2015-05-30 (×4): qty 1

## 2015-05-30 MED ORDER — ASPIRIN EC 81 MG PO TBEC
81.0000 mg | DELAYED_RELEASE_TABLET | Freq: Every day | ORAL | Status: DC
  Administered 2015-05-30 – 2015-06-02 (×4): 81 mg via ORAL
  Filled 2015-05-30 (×4): qty 1

## 2015-05-30 MED ORDER — ACETAMINOPHEN 650 MG RE SUPP: 650.0000 mg | Freq: Four times a day (QID) | RECTAL | Status: DC | PRN

## 2015-05-30 MED ORDER — SODIUM CHLORIDE 0.9% FLUSH
3.0000 mL | Freq: Two times a day (BID) | INTRAVENOUS | Status: DC
  Administered 2015-05-30 – 2015-06-02 (×4): 3 mL via INTRAVENOUS

## 2015-05-30 NOTE — ED Provider Notes (Signed)
CSN: [id number]     Arrival date & time 05/29/15  2203 History  By signing my name below, I, Bethel Born, attest that this documentation has been prepared under the direction and in the presence of Gilda Crease, MD. Electronically Signed: Bethel Born, ED Scribe. 05/30/2015. 2:43 AM    Chief Complaint  Patient presents with  . Leg Swelling  . Shortness of Breath   The history is provided by the patient. No language interpreter was used.   [name] is a 45 y.o. male with history of HTN and MS who presents to the Emergency Department complaining of new bilateral leg pain and swelling with onset yesterday upon waking. He denies any recent unusual activity.  Associated symptoms include exertional SOB. Pt denies chest pain. He notes being under increased stress recently as his infant son is ill.   Past Medical History  Diagnosis Date  . Hypertension   . MS (multiple sclerosis) [facility]    Past Surgical History  Procedure Laterality Date  . Gallbladder surgery      2009   Family History  Problem Relation Age of Onset  . High blood pressure Mother   . High blood pressure Father    Social History  Substance Use Topics  . Smoking status: Current Every Day Smoker    Types: Cigarettes  . Smokeless tobacco: Never Used  . Alcohol Use: 0.0 oz/week     Comment: Social    Review of Systems  Respiratory: Positive for shortness of breath.   Cardiovascular: Positive for leg swelling (and pain). Negative for chest pain.  All other systems reviewed and are negative.     Allergies  Review of patient's allergies indicates no known allergies.  Home Medications   Prior to Admission medications   Medication Sig Start Date End Date Taking? Authorizing Provider  lisinopril-hydrochlorothiazide (PRINZIDE,ZESTORETIC) 10-12.5 MG tablet Take 1 tablet by mouth daily.   Yes Historical Provider, MD  gabapentin (NEURONTIN) 300 MG capsule Take 1 capsule (300 mg total) by mouth 3  (three) times daily. Patient not taking: Reported on 05/30/2015 08/04/12   Levert Feinstein, MD  hydrochlorothiazide (HYDRODIURIL) 12.5 MG tablet Take 1 tablet (12.5 mg total) by mouth daily. Patient not taking: Reported on 05/30/2015 01/10/14   Mathis Fare Presson, PA  lisinopril (PRINIVIL,ZESTRIL) 20 MG tablet Take 1 tablet (20 mg total) by mouth daily. Patient not taking: Reported on 05/30/2015 01/10/14   Jess Barters H Presson, PA   BP 169/128 mmHg  Pulse 79  Temp(Src) 98.2 F (36.8 C) (Oral)  Resp 18  Wt 260 lb 8 oz (118.162 kg)  SpO2 97% Physical Exam  Constitutional: He is oriented to person, place, and time. He appears well-developed and well-nourished. No distress.  HENT:  Head: Normocephalic and atraumatic.  Right Ear: Hearing normal.  Left Ear: Hearing normal.  Nose: Nose normal.  Mouth/Throat: Oropharynx is clear and moist and mucous membranes are normal.  Eyes: Conjunctivae and EOM are normal. Pupils are equal, round, and reactive to light.  Neck: Normal range of motion. Neck supple. JVD present.  Cardiovascular: Regular rhythm, S1 normal and S2 normal.  Exam reveals gallop and S4. Exam reveals no friction rub.   No murmur heard. Pulmonary/Chest: Effort normal. No respiratory distress. He exhibits no tenderness.  Crackles   Abdominal: Soft. Normal appearance and bowel sounds are normal. There is no hepatosplenomegaly. There is no tenderness. There is no rebound, no guarding, no tenderness at McBurney's point and negative Murphy's sign. No  hernia.  Musculoskeletal: Normal range of motion. He exhibits edema.  1-2+ pedal edema   Neurological: He is alert and oriented to person, place, and time. He has normal strength. No cranial nerve deficit or sensory deficit. Coordination normal. GCS eye subscore is 4. GCS verbal subscore is 5. GCS motor subscore is 6.  Skin: Skin is warm, dry and intact. No rash noted. No cyanosis.  Psychiatric: He has a normal mood and affect. His speech is  normal and behavior is normal. Thought content normal.  Nursing note and vitals reviewed.   ED Course  Procedures (including critical care time) DIAGNOSTIC STUDIES: Oxygen Saturation is 97% on RA,  normal by my interpretation.    COORDINATION OF CARE: 1:27 AM Discussed treatment plan which includes lab work, CXR, EKG, and admission to the hospital with pt at bedside and pt agreed to plan.  Labs Review Labs Reviewed  BRAIN NATRIURETIC PEPTIDE - Abnormal; Notable for the following:    B Natriuretic Peptide 1013.4 (*)    All other components within normal limits  COMPREHENSIVE METABOLIC PANEL - Abnormal; Notable for the following:    Glucose, Bld 106 (*)    Creatinine, Ser 1.59 (*)    Total Protein 6.4 (*)    GFR calc non Af Amer 52 (*)    GFR calc Af Amer 60 (*)    All other components within normal limits  I-STAT TROPOININ, ED - Abnormal; Notable for the following:    Troponin i, poc 0.14 (*)    All other components within normal limits  CBC WITH DIFFERENTIAL/PLATELET    Imaging Review Dg Chest 2 View  05/29/2015  CLINICAL DATA:  Acute onset of shortness of breath. Bilateral foot and leg swelling. Initial encounter. EXAM: CHEST  2 VIEW COMPARISON:  None. FINDINGS: The lungs are well-aerated. Vascular congestion is noted. Mild bibasilar atelectasis is noted. There is no evidence of pleural effusion or pneumothorax. The heart is mildly enlarged. No acute osseous abnormalities are seen. IMPRESSION: Vascular congestion and mild cardiomegaly. Mild bibasilar atelectasis noted. Electronically Signed   By: Roanna Raider M.D.   On: 05/29/2015 23:12   I have personally reviewed and evaluated these images and lab results as part of my medical decision-making.   EKG Interpretation None      ED ECG REPORT   Date: 05/30/2015  Rate: 90  Rhythm: normal sinus rhythm and premature ventricular contractions (PVC)  QRS Axis: normal  Intervals: QT prolonged  ST/T Wave abnormalities: LVH with  repol abn  Conduction Disutrbances:none  Narrative Interpretation:   Old EKG Reviewed: none available  I have personally reviewed the EKG tracing and agree with the computerized printout as noted.   MDM   Final diagnoses:  Acute congestive heart failure, unspecified congestive heart failure type [facility])    Patient presents to the ER for evaluation of bilateral leg swelling and shortness of breath. Patient reports he first noticed swelling in his legs earlier this morning. This is not usual for him. He also, however, has been experiencing shortness of breath over the last several days. He has noticed progressively worsening dyspnea on exertion and poor exercise tolerance without associated chest pain. Patient's workup today is concerning for acute onset congestive heart failure. He has an elevated BNP, S4 gallop, crackles on examination. In addition his troponin is elevated, this is likely a small troponin leak secondary to congestive heart failure. There are no ischemic changes on EKG other than LVH with repolarization abnormality.  Discussed with Dr.  Onalee Hua, on-call for cardiology. Agrees with diuresis. Does not recommend nitroglycerin or heparinization based on the patient's labs. Patient appropriate for medicine admission, consultation by cardiology in a.m.  I personally performed the services described in this documentation, which was scribed in my presence. The recorded information has been reviewed and is accurate.    Gilda Crease, MD 05/30/15 [phone]

## 2015-05-30 NOTE — ED Notes (Signed)
Attempted report 

## 2015-05-30 NOTE — H&P (Signed)
[facility] History and Physical  [name] LNL:[id number] DOB: [birth year] DOA: 05/30/2015  Referring physician: Dr. Franky Macho. PCP: Default, Provider, MD [facility]. Specialists: Dr. Charyl Dancer. Neurologist.  Chief Complaint: Shortness of breath.  HPI: [name] is a 45 y.o. male with history of hypertension presents to the ER because of shortness of breath. Patient had originally brought his son for medical checkup in the ER when patient became acutely short of breath. Patient states last week he had some mild infection which has resolved. Over the last 2 days patient has been noticing increasing lower extremity edema. And tonight when patient was in the ER with his son patient became very short of breath. Denies any chest pain and productive cough fever chills. Chest x-ray shows congestion. Patient's troponin is mildly elevated and blood pressure was markedly elevated. Patient was given Lasix 40 mg IV 1 dose and on-call cardiologist was consulted by the ER physician. Patient will be admitted for further management of acute CHF unspecified EF.   Review of Systems: As presented in the history of presenting illness, rest negative.  Past Medical History  Diagnosis Date  . Hypertension   . MS (multiple sclerosis) [facility])    Past Surgical History  Procedure Laterality Date  . Gallbladder surgery      2009   Social History:  reports that he has been smoking Cigarettes.  He has never used smokeless tobacco. He reports that he drinks alcohol. He reports that he does not use illicit drugs. Where does patient live Home. Can patient participate in ADLs? Yes.  No Known Allergies  Family History:  Family History  Problem Relation Age of Onset  . High blood pressure Mother   . High blood pressure Father       Prior to Admission medications   Medication Sig Start Date End Date Taking? Authorizing Provider  lisinopril-hydrochlorothiazide (PRINZIDE,ZESTORETIC) 10-12.5  MG tablet Take 1 tablet by mouth daily.   Yes Historical Provider, MD  gabapentin (NEURONTIN) 300 MG capsule Take 1 capsule (300 mg total) by mouth 3 (three) times daily. Patient not taking: Reported on 05/30/2015 08/04/12   Levert Feinstein, MD  hydrochlorothiazide (HYDRODIURIL) 12.5 MG tablet Take 1 tablet (12.5 mg total) by mouth daily. Patient not taking: Reported on 05/30/2015 01/10/14   Mathis Fare Presson, PA  lisinopril (PRINIVIL,ZESTRIL) 20 MG tablet Take 1 tablet (20 mg total) by mouth daily. Patient not taking: Reported on 05/30/2015 01/10/14   Ria Clock, PA    Physical Exam: Filed Vitals:   05/29/15 2223 05/30/15 0130 05/30/15 0200  BP: 169/128 182/141 169/138  Pulse: 79 87 85  Temp: 98.2 F (36.8 C)    TempSrc: Oral    Resp: 18 14 18   Weight: 260 lb 8 oz (118.162 kg)    SpO2: 97% 99% 99%     General:  Moderately built and nourished.  Eyes: Anicteric no pallor.  ENT: No discharge from the ears eyes nose and mouth.  Neck: No mass felt. JVD elevated.  Cardiovascular: S1 and S2 heard.  Respiratory: No rhonchi or crepitations.  Abdomen: Soft nontender bowel sounds present.  Skin: No rash.  Musculoskeletal: Bilateral lower extremity edema.  Psychiatric: Appears normal.  Neurologic: Alert awake oriented to time place and person. Moves all extremities.  Labs on Admission:  Basic Metabolic Panel:  Recent Labs Lab 05/29/15 2237  NA 140  K 3.6  CL 106  CO2 26  GLUCOSE 106*  BUN 17  CREATININE 1.59*  CALCIUM  9.0   Liver Function Tests:  Recent Labs Lab 05/29/15 2237  AST 26  ALT 43  ALKPHOS 42  BILITOT 0.8  PROT 6.4*  ALBUMIN 3.5   No results for input(s): LIPASE, AMYLASE in the last 168 hours. No results for input(s): AMMONIA in the last 168 hours. CBC:  Recent Labs Lab 05/29/15 2237  WBC 9.3  NEUTROABS 6.5  HGB 13.0  HCT 41.1  MCV 82.4  PLT 218   Cardiac Enzymes: No results for input(s): CKTOTAL, CKMB, CKMBINDEX, TROPONINI in  the last 168 hours.  BNP (last 3 results)  Recent Labs  05/29/15 2237  BNP 1013.4*    ProBNP (last 3 results) No results for input(s): PROBNP in the last 8760 hours.  CBG: No results for input(s): GLUCAP in the last 168 hours.  Radiological Exams on Admission: Dg Chest 2 View  05/29/2015  CLINICAL DATA:  Acute onset of shortness of breath. Bilateral foot and leg swelling. Initial encounter. EXAM: CHEST  2 VIEW COMPARISON:  None. FINDINGS: The lungs are well-aerated. Vascular congestion is noted. Mild bibasilar atelectasis is noted. There is no evidence of pleural effusion or pneumothorax. The heart is mildly enlarged. No acute osseous abnormalities are seen. IMPRESSION: Vascular congestion and mild cardiomegaly. Mild bibasilar atelectasis noted. Electronically Signed   By: Roanna Raider M.D.   On: 05/29/2015 23:12    EKG: Independently reviewed. Sinus rhythm with LVH.  Assessment/Plan Principal Problem:   Acute CHF (congestive heart failure) (HCC) Active Problems:   MS (multiple sclerosis) (HCC)   Hypertensive urgency   CKD (chronic kidney disease) stage 3, GFR 30-59 ml/min   CHF (congestive heart failure) (HCC)   1. Acute CHF unspecified EF - patient's symptoms are consistent with CHF. We will check 2-D echo. Check Dopplers of the lower extremity. Cycle cardiac markers. Patient is placed on Lasix 40 mg IV every 12. Patient is on lisinopril and hydrochlorothiazide which will be continued. 2. Hypertensive urgency - probably contributing to #1. Patient in addition to his home medications lisinopril and hydrochlorothiazide that I have placed patient on when necessary IV hydralazine and patient is only on Lasix. Closely follow blood pressure trends. 3. Chronic kidney disease stage III - since patient is on Lasix at this time and patient is already on lisinopril closely follow metabolic panel for any worsening of creatinine. 4. History of multiple sclerosis - follows up with Dr. Charyl Dancer.  Neurologist.   DVT Prophylaxis Lovenox.  Code Status: Full code.  Family Communication: Discussed with patient.  Disposition Plan: Admit to inpatient.    Deon Duer N. [facility] Pager [phone].  If 7PM-7AM, please contact night-coverage [email or web address] Password [facility] 05/30/2015, 3:58 AM

## 2015-05-30 NOTE — Progress Notes (Addendum)
Lower extremity venous duplex order received.  When transporter arrived at patient's room to bring patient to the lab for testing, patient refused at this time stating he "hasn't gotten any sleep". Will try again later if/when patient is agreeable, and as lab scheduling permits.  9:11 AM 05/30/2015   Transporter called the lab and notified us that the patient is willing to have the test now. 05/30/2015 9:17 AM   *Preliminary Results* Bilateral lower extremity venous duplex completed. Visualized veins of bilateral lower extremities are negative for deep vein thrombosis. There is no evidence of Baker's cyst bilaterally.  05/30/2015 9:40 AM  Gertie Fey, RVT, RDCS, RDMS

## 2015-05-30 NOTE — Progress Notes (Signed)
Patient is seen & examined. Please see today's H&P for the details. 45 y/o male with PMH of HTN, MS presented with dyspnea. Patient denies acute chest pain to me. Admitted with CHF. Patient reports some improvement in breathing after IV lasix. We will cont IV lasix, Lisinopril, added low dose BB. Monitor I/O, daily weight. Labs and troponins.  [name]

## 2015-05-30 NOTE — Consult Note (Signed)
Cardiology Consult    Patient ID: [name] MRN: [id number], DOB/AGE: 1971/02/27   Admit date: 05/30/2015 Date of Consult: 05/30/2015  Primary Physician: Default, Provider, MD Reason for Consult: CHF Primary Cardiologist: New to [facility]  Requesting Provider: Dr. Toniann Fail   History of Present Illness    [name] is a 45 y.o. male with past medical history of HTN, Multiple Sclerosis, and tobacco abuse who presented to [facility] ED on 05/29/2015 for worsening lower extremity edema and shortness of breath.   The patient reports noticing worsening lower extremity for the past 2-3 days but did not notice any respiratory symptoms until yesterday. He had brought his 8 month old to the ED for evaluation and started feeling short of breath. Thought it was secondary to the stress of his child being sick. He denies any associated chest pain or palpitations. Denies any recent orthopnea or PND. Says he did quit taking his BP medications 3+ weeks ago due to running out of the medications and not following up with his PCP.  While in the ED, his SBP was elevated into the 180's. BNP 1013. WBC 9.3. Hgb 13.0. Platelets 218. K+ 3.6. Creatinine 1.59. TSH 3.9. Cyclic troponin values have been 0.14 and 0.12 thus far. CXR showed vascular congestion and mild cardiomegaly with mild bibasilar atelectasis. Lower extremity dopplers were performed and negative for DVT.  Was started on IV Lasix  BID with a net output of -4.3L thus far. Has been restarted on PTA Lisinopril-HCTZ with SBP currently improved into the 160's.   At the time of this encounter, he reports his breathing has improved but still has swelling along his feet and lower legs. Denies any other symptoms at this time. He currently appears very tired and had to be woken up multiple times while I was in the room. He is responsive to questions and gives appropriate answers to questions. Reports not getting much sleep due to being in the ED and  changing rooms early this morning.  He denies any prior cardiac history. Unaware of family history of CAD. Does have a 10-pack year history. No excessive alcohol use.   Past Medical History   Past Medical History  Diagnosis Date  . Hypertension   . MS (multiple sclerosis) [facility])     Past Surgical History  Procedure Laterality Date  . Gallbladder surgery      2009     Allergies  No Known Allergies  Inpatient Medications    . aspirin EC  81 mg Oral Daily  . enoxaparin (LOVENOX) injection  60 mg Subcutaneous Q24H  . furosemide  40 mg Intravenous Q12H  . lisinopril  10 mg Oral Daily   And  . hydrochlorothiazide  12.5 mg Oral Daily  . metoprolol tartrate  12.5 mg Oral BID  . potassium chloride  10 mEq Oral Daily  . sodium chloride flush  3 mL Intravenous Q12H    Family History    Family History  Problem Relation Age of Onset  . High blood pressure Mother   . High blood pressure Father     Social History    Social History   Social History  . Marital Status: Single    Spouse Name: N/A  . Number of Children: 2  . Years of Education: 12   Occupational History  . not working    Social History Main Topics  . Smoking status: Current Every Day Smoker    Types: Cigarettes  . Smokeless tobacco: Never Used  .  Alcohol Use: 0.0 oz/week     Comment: Social  . Drug Use: No  . Sexual Activity: Yes    Birth Control/ Protection: Condom   Other Topics Concern  . Not on file   Social History Narrative   Patient lives at home and he is single. Patient does not work. Patient has 2 children. Right handed.     Review of Systems    General:  No chills, fever, night sweats or weight changes.  Cardiovascular:  No chest pain, dyspnea on exertion, orthopnea, palpitations, paroxysmal nocturnal dyspnea. Positive for edema. Dermatological: No rash, lesions/masses Respiratory: No cough, Positive for dyspnea. Urologic: No hematuria, dysuria Abdominal:   No nausea, vomiting,  diarrhea, bright red blood per rectum, melena, or hematemesis Neurologic:  No visual changes, wkns, changes in mental status. All other systems reviewed and are otherwise negative except as noted above.  Physical Exam    Blood pressure 164/125, pulse 80, temperature 98.2 F (36.8 C), temperature source Oral, resp. rate 18, height 5' 10.08" (1.78 m), weight 254 lb 1.6 oz (115.259 kg), SpO2 99 %.  General: Pleasant, African American male appearing in NAD Psych: Normal affect. Neuro: Alert and oriented X 3. Moves all extremities spontaneously. HEENT: Normal  Neck: Supple without bruits or JVD. Lungs:  Resp regular and unlabored, minimal rales at the bases bilaterally. Heart: RRR, no murmurs. Gallop present.  Abdomen: Soft, non-tender, non-distended, BS + x 4.  Extremities: No clubbing or cyanosis. 1+ edema bilaterally up to mid-shins. DP/PT/Radials 2+ and equal bilaterally.  Labs    Troponin [facility] Test)  Recent Labs  05/29/15 2234  TROPIPOC 0.14*    Recent Labs  05/30/15 0624  TROPONINI 0.12*   Lab Results  Component Value Date   WBC 9.8 05/30/2015   HGB 14.2 05/30/2015   HCT 44.9 05/30/2015   MCV 82.5 05/30/2015   PLT 216 05/30/2015    Recent Labs Lab 05/30/15 0624  NA 143  K 3.6  CL 102  CO2 34*  BUN 17  CREATININE 1.66*  CALCIUM 9.2  PROT 6.8  BILITOT 0.7  ALKPHOS 49  ALT 46  AST 29  GLUCOSE 83     Radiology Studies    Dg Chest 2 View: 05/29/2015  CLINICAL DATA:  Acute onset of shortness of breath. Bilateral foot and leg swelling. Initial encounter. EXAM: CHEST  2 VIEW COMPARISON:  None. FINDINGS: The lungs are well-aerated. Vascular congestion is noted. Mild bibasilar atelectasis is noted. There is no evidence of pleural effusion or pneumothorax. The heart is mildly enlarged. No acute osseous abnormalities are seen. IMPRESSION: Vascular congestion and mild cardiomegaly. Mild bibasilar atelectasis noted. Electronically Signed   By: Roanna Raider  M.D.   On: 05/29/2015 23:12   EKG & Cardiac Imaging    EKG: Sinus rhythm, HR 85, LVH with repolarization abnormality.  Echocardiogram: Pending  Assessment & Plan    1. Acute CHF Exacerbation - presented with worsening shortness of breath and lower extremity for the past two days. Had not taken his BP medications for 3+ weeks. - BNP elevated to 1013. CXR showed vascular congestion and mild cardiomegaly with mild bibasilar atelectasis.  - echocardiogram is pending to further clarify type of CHF. Likely etiology of exacerbation is accelerated HTN in the setting of not taking his medications. If EF is significantly reduced on his echo, would require an ischemic evaluation.  - started on IV Lasix 40mg  BID with a net output of -4.3L thus far. Would continue  with this current rate as he still appears volume overloaded and is responding appropriately to the current dose at this time.  - continue ACE-I and BB. Will titrate as HR and BP allow.  2. Accelerated HTN - BP elevated to 180's / 140's at time of admission. Had not taken his PTA Lisinopril/HCTZ in 3 weeks. - restarted on Lisinopril/HCTZ combo. With worsening creatinine and while receiving IV Lasix, will d/c his HCTZ.  - started on Lopressor 12.5mg  BID. May need further titration pending BP and HR response.  3. Elevated Troponin - mildly elevated to 0.14 and 0.12 thus far this admission. - denies any recent chest discomfort.  - echocardiogram is pending to assess LV function and wall motion. Further ischemic evaluation as above.  4. Stage 2 CKD - creatinine 1.30 one year ago.  - at 1.59 on admission. Continue to monitor while receiving IV diuresis. - will d/c HCTZ at this time.  5. Tobacco Abuse - smoking cessation advised.  Signed, Ellsworth Lennox, PA-C 05/30/2015, 10:36 AM Pager: [phone]  I have personally seen and examined this patient with Randall An, PA-C. I agree with the assessment and plan as outlined above.  He is admitted with volume overload and dyspnea. My exam shows well developed male in NAD, RRR with no murmur, lungs clear, 1+ LE edema. Labs reviewed. Will get echo today. Continue IV Lasix. BP is better controlled.   MCALHANY,CHRISTOPHER 05/30/2015 12:42 PM'

## 2015-05-30 NOTE — Progress Notes (Signed)
Patient received from ED. Oriented to room. Call light within reach.

## 2015-05-31 ENCOUNTER — Inpatient Hospital Stay (HOSPITAL_COMMUNITY): Payer: Medicaid Other

## 2015-05-31 DIAGNOSIS — I420 Dilated cardiomyopathy: Secondary | ICD-10-CM

## 2015-05-31 DIAGNOSIS — I5021 Acute systolic (congestive) heart failure: Secondary | ICD-10-CM

## 2015-05-31 DIAGNOSIS — R0600 Dyspnea, unspecified: Secondary | ICD-10-CM

## 2015-05-31 DIAGNOSIS — I5043 Acute on chronic combined systolic (congestive) and diastolic (congestive) heart failure: Secondary | ICD-10-CM | POA: Diagnosis present

## 2015-05-31 DIAGNOSIS — R778 Other specified abnormalities of plasma proteins: Secondary | ICD-10-CM | POA: Diagnosis present

## 2015-05-31 DIAGNOSIS — R7989 Other specified abnormal findings of blood chemistry: Secondary | ICD-10-CM

## 2015-05-31 DIAGNOSIS — I428 Other cardiomyopathies: Secondary | ICD-10-CM | POA: Clinically undetermined

## 2015-05-31 LAB — BASIC METABOLIC PANEL
ANION GAP: 10 (ref 5–15)
BUN: 21 mg/dL — AB (ref 6–20)
CHLORIDE: 100 mmol/L — AB (ref 101–111)
CO2: 32 mmol/L (ref 22–32)
Calcium: 9.3 mg/dL (ref 8.9–10.3)
Creatinine, Ser: 1.68 mg/dL — ABNORMAL HIGH (ref 0.61–1.24)
GFR calc Af Amer: 56 mL/min — ABNORMAL LOW (ref >60)
GFR calc non Af Amer: 48 mL/min — ABNORMAL LOW (ref >60)
GLUCOSE: 90 mg/dL (ref 65–99)
POTASSIUM: 3.5 mmol/L (ref 3.5–5.1)
Sodium: 142 mmol/L (ref 135–145)

## 2015-05-31 LAB — ECHOCARDIOGRAM COMPLETE
HEIGHTINCHES: 70.079 in
WEIGHTICAEL: 3956.8 [oz_av]

## 2015-05-31 MED ORDER — HYDRALAZINE HCL 25 MG PO TABS
25.0000 mg | ORAL_TABLET | Freq: Three times a day (TID) | ORAL | Status: DC
  Administered 2015-05-31 – 2015-06-02 (×6): 25 mg via ORAL
  Filled 2015-05-31 (×6): qty 1

## 2015-05-31 MED ORDER — SODIUM CHLORIDE 0.9 % IV SOLN
INTRAVENOUS | Status: DC
  Administered 2015-06-01: 50 mL/h via INTRAVENOUS

## 2015-05-31 MED ORDER — POTASSIUM CHLORIDE CRYS ER 20 MEQ PO TBCR
40.0000 meq | EXTENDED_RELEASE_TABLET | Freq: Once | ORAL | Status: AC
  Administered 2015-05-31: 40 meq via ORAL
  Filled 2015-05-31: qty 2

## 2015-05-31 MED ORDER — CARVEDILOL 6.25 MG PO TABS
6.2500 mg | ORAL_TABLET | Freq: Two times a day (BID) | ORAL | Status: DC
  Administered 2015-05-31: 6.25 mg via ORAL
  Filled 2015-05-31: qty 1

## 2015-05-31 MED ORDER — SODIUM CHLORIDE 0.9% FLUSH: 3.0000 mL | Freq: Two times a day (BID) | INTRAVENOUS | Status: DC

## 2015-05-31 MED ORDER — ENOXAPARIN SODIUM 60 MG/0.6ML ~~LOC~~ SOLN
55.0000 mg | SUBCUTANEOUS | Status: DC
  Administered 2015-05-31 – 2015-06-02 (×2): 55 mg via SUBCUTANEOUS
  Filled 2015-05-31 (×2): qty 0.6

## 2015-05-31 MED ORDER — SODIUM CHLORIDE 0.9 % IV SOLN
250.0000 mL | INTRAVENOUS | Status: DC | PRN
Start: 2015-05-31 — End: 2015-06-01

## 2015-05-31 MED ORDER — SODIUM CHLORIDE 0.9% FLUSH: 3.0000 mL | INTRAVENOUS | Status: DC | PRN

## 2015-05-31 MED ORDER — CARVEDILOL 12.5 MG PO TABS
12.5000 mg | ORAL_TABLET | Freq: Two times a day (BID) | ORAL | Status: DC
  Administered 2015-05-31 – 2015-06-02 (×3): 12.5 mg via ORAL
  Filled 2015-05-31 (×3): qty 1

## 2015-05-31 MED ORDER — FUROSEMIDE 40 MG PO TABS: 40.0000 mg | ORAL_TABLET | Freq: Every day | ORAL | Status: DC

## 2015-05-31 MED ORDER — ASPIRIN 81 MG PO CHEW: 81.0000 mg | CHEWABLE_TABLET | ORAL | Status: DC

## 2015-05-31 MED ORDER — ISOSORBIDE MONONITRATE ER 30 MG PO TB24
30.0000 mg | ORAL_TABLET | Freq: Every day | ORAL | Status: DC
  Administered 2015-05-31: 30 mg via ORAL
  Filled 2015-05-31: qty 1

## 2015-05-31 MED ORDER — FUROSEMIDE 40 MG PO TABS
40.0000 mg | ORAL_TABLET | Freq: Every day | ORAL | Status: DC
  Administered 2015-05-31 – 2015-06-02 (×2): 40 mg via ORAL
  Filled 2015-05-31 (×3): qty 1

## 2015-05-31 MED ORDER — LISINOPRIL 5 MG PO TABS
5.0000 mg | ORAL_TABLET | Freq: Every day | ORAL | Status: DC
  Administered 2015-06-01 – 2015-06-02 (×2): 5 mg via ORAL
  Filled 2015-05-31 (×2): qty 1

## 2015-05-31 NOTE — Progress Notes (Signed)
[facility] Progress Note                                                                                                                                                                                                                      Patient Demographics:    [name], is a 45 y.o. male, DOB - February 13, 1971, ZOX:[id number]  Admit date - 05/30/2015   Admitting Physician Eduard Clos, MD  Outpatient Primary MD for the patient is Default, Provider, MD goes to [facility]  LOS - 1  Outpatient Specialists:Outpatient neurologist Dr. Charyl Dancer  Chief Complaint  Patient presents with  . Leg Swelling  . Shortness of Breath        Subjective:    [name] today has, No headache, No chest pain, No abdominal pain - No Nausea, No new weakness tingling or numbness, No Cough - Much improved shortness of breath.   Assessment  & Plan :     1.Acute on chronic congestive heart failure type unclear. No echogram in chart. Seen by cardiology, is currently 7 L negative with IV Lasix, lungs almost completely clear with trace edema, we'll switch to oral Lasix, blood pressure is high so will switch from Lopressor to Coreg, added hydralazine and Imdur. Echogram pending. Will monitor with cardiology.  2. Mild troponin rise and non-ACS pattern likely due to demand ischemia from CHF. On aspirin, beta blocker, Imdur added, echo pending to evaluate wall motion and EF. Further workup per cardiology if needed.  3. Essential hypertension with hypertensive urgency upon admission. Noncompliant with medications. Counseled. Blood pressure medications have been adjusted to Coreg, hydralazine and Imdur. Monitor blood pressure.  4. History of MS. No acute issues. Outpatient follow-up with his urologist Dr. Charyl Dancer   5. CKD stage III. Last creatinine in the system was 1.3 close to one year  ago, creatinine now 1.6, this could be his new baseline, we will cut ACE inhibitor into half dose and monitor. Outpatient renal follow-up if creatinine stays stable at around 1.6.   Code Status : Full  Family Communication  : None  Disposition Plan  : Home in 1-2 days  Barriers For Discharge : Cardiac workup  Consults  :  Cards  Procedures  :   TTE pending  DVT Prophylaxis  :  Lovenox    Lab Results  Component Value Date   PLT 216 05/30/2015    Antibiotics  :    Anti-infectives    None  Objective:   Filed Vitals:   05/30/15 1000 05/30/15 1300 05/30/15 1925 05/31/15 0359  BP:  114/99 164/114 136/104  Pulse:  75 75 113  Temp:   98.4 F (36.9 C) 98.6 F (37 C)  TempSrc:   Oral Oral  Resp:  18 18 18   Height: 5' 10.08" (1.78 m)     Weight:    112.175 kg (247 lb 4.8 oz)  SpO2:  99% 99% 97%    Wt Readings from Last 3 Encounters:  05/31/15 112.175 kg (247 lb 4.8 oz)  08/04/12 115.667 kg (255 lb)     Intake/Output Summary (Last 24 hours) at 05/31/15 1017 Last data filed at 05/31/15 0728  Gross per 24 hour  Intake    240 ml  Output   3025 ml  Net  -2785 ml     Physical Exam  Awake Alert, Oriented X 3, No new F.N deficits, Normal affect [place].AT,PERRAL Supple Neck,No JVD, No cervical lymphadenopathy appriciated.  Symmetrical Chest wall movement, Good air movement bilaterally, CTAB, no rales RRR,No Gallops,Rubs or new Murmurs, No Parasternal Heave +ve B.Sounds, Abd Soft, No tenderness, No organomegaly appriciated, No rebound - guarding or rigidity. No Cyanosis, Clubbing , trace edema, No new Rash or bruise       Data Review:    CBC  Recent Labs Lab 05/29/15 2237 05/30/15 0624  WBC 9.3 9.8  HGB 13.0 14.2  HCT 41.1 44.9  PLT 218 216  MCV 82.4 82.5  MCH 26.1 26.1  MCHC 31.6 31.6  RDW 14.5 14.8  LYMPHSABS 1.8 2.3  MONOABS 0.8 0.6  EOSABS 0.2 0.3  BASOSABS 0.0 0.0    Chemistries   Recent Labs Lab 05/29/15 2237 05/30/15 0624  05/31/15 0310  NA 140 143 142  K 3.6 3.6 3.5  CL 106 102 100*  CO2 26 34* 32  GLUCOSE 106* 83 90  BUN 17 17 21*  CREATININE 1.59* 1.66* 1.68*  CALCIUM 9.0 9.2 9.3  MG  --  2.0  --   AST 26 29  --   ALT 43 46  --   ALKPHOS 42 49  --   BILITOT 0.8 0.7  --    ------------------------------------------------------------------------------------------------------------------ No results for input(s): CHOL, HDL, LDLCALC, TRIG, CHOLHDL, LDLDIRECT in the last 72 hours.  No results found for: HGBA1C ------------------------------------------------------------------------------------------------------------------  Recent Labs  05/30/15 0624  TSH 3.923   ------------------------------------------------------------------------------------------------------------------ No results for input(s): VITAMINB12, FOLATE, FERRITIN, TIBC, IRON, RETICCTPCT in the last 72 hours.  Coagulation profile No results for input(s): INR, PROTIME in the last 168 hours.  No results for input(s): DDIMER in the last 72 hours.  Cardiac Enzymes  Recent Labs Lab 05/30/15 0624 05/30/15 1104 05/30/15 1753  TROPONINI 0.12* 0.11* 0.11*   ------------------------------------------------------------------------------------------------------------------    Component Value Date/Time   BNP 1013.4* 05/29/2015 2237    Inpatient Medications  Scheduled Meds: . aspirin EC  81 mg Oral Daily  . enoxaparin (LOVENOX) injection  55 mg Subcutaneous Q24H  . furosemide  40 mg Oral Daily  . lisinopril  5 mg Oral Daily  . metoprolol tartrate  12.5 mg Oral BID  . potassium chloride  10 mEq Oral Daily  . potassium chloride  40 mEq Oral Once  . sodium chloride flush  3 mL Intravenous Q12H   Continuous Infusions:  PRN Meds:.acetaminophen **OR** [DISCONTINUED] acetaminophen, hydrALAZINE, [DISCONTINUED] ondansetron **OR** ondansetron (ZOFRAN) IV  Micro Results No results found for this or any previous visit (from the past  240 hour(s)).  Radiology  Reports Dg Chest 2 View  05/29/2015  CLINICAL DATA:  Acute onset of shortness of breath. Bilateral foot and leg swelling. Initial encounter. EXAM: CHEST  2 VIEW COMPARISON:  None. FINDINGS: The lungs are well-aerated. Vascular congestion is noted. Mild bibasilar atelectasis is noted. There is no evidence of pleural effusion or pneumothorax. The heart is mildly enlarged. No acute osseous abnormalities are seen. IMPRESSION: Vascular congestion and mild cardiomegaly. Mild bibasilar atelectasis noted. Electronically Signed   By: Roanna Raider M.D.   On: 05/29/2015 23:12    Time Spent in minutes 25   Yakira Duquette K M.D on 05/31/2015 at 10:17 AM  Between 7am to 7pm - Pager - [phone]  After 7pm go to [email or web address] - password [facility]  [facility] -  Office  [phone]

## 2015-05-31 NOTE — Progress Notes (Signed)
Echocardiogram 2D Echocardiogram has been performed.  Nolon Rod 05/31/2015, 12:06 PM

## 2015-05-31 NOTE — Progress Notes (Signed)
Hospital Problem List     Principal Problem:   Acute CHF (congestive heart failure) (HCC) Active Problems:   MS (multiple sclerosis) (HCC)   Hypertensive urgency   CKD (chronic kidney disease) stage 3, GFR 30-59 ml/min   CHF (congestive heart failure) [facility])    Patient Profile:   Primary Cardiologist: New to [facility]; Dr. Clifton James  45 y.o. male w/ PMH of HTN, Multiple Sclerosis, and tobacco abuse who presented to [facility] ED on 05/29/2015 for worsening lower extremity edema and shortness of breath.  Subjective   Says his breathing has improved. Denies any chest pain or palpitations.  Inpatient Medications    . aspirin EC  81 mg Oral Daily  . carvedilol  6.25 mg Oral BID WC  . enoxaparin (LOVENOX) injection  55 mg Subcutaneous Q24H  . furosemide  40 mg Oral Daily  . hydrALAZINE  25 mg Oral 3 times per day  . isosorbide mononitrate  30 mg Oral Daily  . lisinopril  5 mg Oral Daily  . potassium chloride  10 mEq Oral Daily  . potassium chloride  40 mEq Oral Once  . sodium chloride flush  3 mL Intravenous Q12H    Vital Signs    Filed Vitals:   05/30/15 1000 05/30/15 1300 05/30/15 1925 05/31/15 0359  BP:  114/99 164/114 136/104  Pulse:  75 75 113  Temp:   98.4 F (36.9 C) 98.6 F (37 C)  TempSrc:   Oral Oral  Resp:  18 18 18   Height: 5' 10.08" (1.78 m)     Weight:    247 lb 4.8 oz (112.175 kg)  SpO2:  99% 99% 97%    Intake/Output Summary (Last 24 hours) at 05/31/15 1113 Last data filed at 05/31/15 6226  Gross per 24 hour  Intake    240 ml  Output   3025 ml  Net  -2785 ml   Filed Weights   05/29/15 2223 05/30/15 0537 05/31/15 0359  Weight: 260 lb 8 oz (118.162 kg) 254 lb 1.6 oz (115.259 kg) 247 lb 4.8 oz (112.175 kg)    Physical Exam    General: Well developed, well nourished, male appearing in no acute distress. Head: Normocephalic, atraumatic.  Neck: Supple without bruits, JVD not elevated. Lungs:  Resp regular and unlabored, CTA without wheezing or  rales. Heart: RRR, S1, S2, no S3, S4, or murmur; no rub. Abdomen: Soft, non-tender, non-distended with normoactive bowel sounds. No hepatomegaly. No rebound/guarding. No obvious abdominal masses. Extremities: No clubbing or cyanosis, trace lower extremity edema. Distal pedal pulses are 2+ bilaterally. Neuro: Alert and oriented X 3. Moves all extremities spontaneously. Psych: Normal affect.  Labs    CBC  Recent Labs  05/29/15 2237 05/30/15 0624  WBC 9.3 9.8  NEUTROABS 6.5 6.6  HGB 13.0 14.2  HCT 41.1 44.9  MCV 82.4 82.5  PLT 218 216   Basic Metabolic Panel  Recent Labs  05/30/15 0624 05/31/15 0310  NA 143 142  K 3.6 3.5  CL 102 100*  CO2 34* 32  GLUCOSE 83 90  BUN 17 21*  CREATININE 1.66* 1.68*  CALCIUM 9.2 9.3  MG 2.0  --    Liver Function Tests  Recent Labs  05/29/15 2237 05/30/15 0624  AST 26 29  ALT 43 46  ALKPHOS 42 49  BILITOT 0.8 0.7  PROT 6.4* 6.8  ALBUMIN 3.5 3.8   No results for input(s): LIPASE, AMYLASE in the last 72 hours. Cardiac Enzymes  Recent Labs  05/30/15 0624 05/30/15 1104 05/30/15 1753  TROPONINI 0.12* 0.11* 0.11*   Thyroid Function Tests  Recent Labs  05/30/15 0624  TSH 3.923    Telemetry    NSR; HR in the 70's - 80's. One episode of 4 beats NSVT noted.  ECG    No new tracings.   Cardiac Studies and Radiology    Dg Chest 2 View: 05/29/2015  CLINICAL DATA:  Acute onset of shortness of breath. Bilateral foot and leg swelling. Initial encounter. EXAM: CHEST  2 VIEW COMPARISON:  None. FINDINGS: The lungs are well-aerated. Vascular congestion is noted. Mild bibasilar atelectasis is noted. There is no evidence of pleural effusion or pneumothorax. The heart is mildly enlarged. No acute osseous abnormalities are seen. IMPRESSION: Vascular congestion and mild cardiomegaly. Mild bibasilar atelectasis noted. Electronically Signed   By: Roanna Raider M.D.   On: 05/29/2015 23:12   Echocardiogram: Pending  Assessment & Plan     1. Acute CHF Exacerbation - presented with worsening shortness of breath and lower extremity for the past two days. Had not taken his BP medications for 3+ weeks. - BNP elevated to 1013. CXR showed vascular congestion and mild cardiomegaly with mild bibasilar atelectasis.  - echocardiogram is pending to further clarify type of CHF. Likely etiology of exacerbation is accelerated HTN in the setting of not taking his medications. If EF is significantly reduced on his echo, would require an ischemic evaluation.  - started on IV Lasix 40mg  BID with a net output of -7.1L thus far. Switched to PO Lasix 40mg  daily by admitting team today. - continue ACE-I and BB. Will titrate Coreg to assist with ectopy on telemetry.  2. Accelerated HTN - BP elevated to 180's / 140's at time of admission. Had not taken his PTA Lisinopril/HCTZ in 3 weeks. - restarted on Lisinopril/HCTZ combo. With worsening creatinine and while receiving IV Lasix, will d/c his HCTZ.  - Continue BB and ACE-I. Will d/c Imdur in order to titrate Coreg due to ectopy noted on telemetry. Started on Hydralazine 25mg  TID by admitting team. Concern for compliance with this regimen in the outpatient setting.  3. Elevated Troponin - mildly elevated to 0.14 and 0.12 thus far this admission. - denies any recent chest discomfort.  - echocardiogram is pending to assess LV function and wall motion. Further ischemic evaluation as above.  4. Stage 2 CKD - creatinine 1.30 one year ago.  - at 1.59 on admission, 1.68 on 05/31/2015. Continue to monitor while receiving IV diuresis. - d/c HCTZ at this time. No titration of ACE-I at this time due to elevated creatinine.  5. Tobacco Abuse - smoking cessation advised.  Leonides Schanz Ellsworth Lennox , PA-C 11:13 AM 05/31/2015 Pager: [phone]  Echo 05/31/15:Left ventricle: The cavity size was moderately dilated. Wall  thickness was increased in a pattern of moderate LVH. Systolic  function was  severely reduced. The estimated ejection fraction  was in the range of 25% to 30%. Diffuse hypokinesis. Doppler  parameters are consistent with both elevated ventricular  end-diastolic filling pressure and elevated left atrial filling  pressure. - Mitral valve: There was mild regurgitation. - Left atrium: The atrium was mildly dilated.  I have personally seen and examined this patient with Randall An, PA-C. I agree with the assessment and plan as outlined above. He is admitted with hypertensive urgency and volume overload. Echo shows LVEF=25-30%. Mild MR. This may be related to his hypertension but with mildly elevated troponin and new LV dysfunction, will need  right and left heart cath tomorrow. Renal function is stable. Repeat BMET in am. He has diuresed well and was changed to po Lasix today by primary team. BP med adjustment by primary team. Will place on cath schedule for tomorrow. Risks and benefits of procedure reviewed with pt.   Mellanie Bejarano 05/31/2015 1:07 PM

## 2015-06-01 ENCOUNTER — Encounter (HOSPITAL_COMMUNITY)
Admission: EM | Disposition: A | Discharge status: Home / Self Care | Payer: Self-pay | Source: Home / Self Care | Attending: Internal Medicine

## 2015-06-01 DIAGNOSIS — N183 Chronic kidney disease, stage 3 (moderate): Secondary | ICD-10-CM

## 2015-06-01 DIAGNOSIS — Z9114 Patient's other noncompliance with medication regimen: Secondary | ICD-10-CM

## 2015-06-01 DIAGNOSIS — I5043 Acute on chronic combined systolic (congestive) and diastolic (congestive) heart failure: Secondary | ICD-10-CM

## 2015-06-01 DIAGNOSIS — Z720 Tobacco use: Secondary | ICD-10-CM

## 2015-06-01 HISTORY — PX: CARDIAC CATHETERIZATION: SHX172

## 2015-06-01 LAB — POCT I-STAT 3, VENOUS BLOOD GAS (G3P V)
ACID-BASE EXCESS: 3 mmol/L — AB (ref 0.0–2.0)
Acid-Base Excess: 2 mmol/L (ref 0.0–2.0)
BICARBONATE: 26.8 meq/L — AB (ref 20.0–24.0)
BICARBONATE: 29.8 meq/L — AB (ref 20.0–24.0)
O2 SAT: 72 %
O2 Saturation: 99 %
PCO2 VEN: 55 mmHg — AB (ref 45.0–50.0)
PH VEN: 7.404 — AB (ref 7.250–7.300)
PO2 VEN: 117 mmHg — AB (ref 31.0–45.0)
TCO2: 28 mmol/L (ref 0–100)
TCO2: 31 mmol/L (ref 0–100)
pCO2, Ven: 43 mmHg — ABNORMAL LOW (ref 45.0–50.0)
pH, Ven: 7.342 — ABNORMAL HIGH (ref 7.250–7.300)
pO2, Ven: 41 mmHg (ref 31.0–45.0)

## 2015-06-01 LAB — CBC
HEMATOCRIT: 45.8 % (ref 39.0–52.0)
HEMOGLOBIN: 14.3 g/dL (ref 13.0–17.0)
MCH: 25.8 pg — AB (ref 26.0–34.0)
MCHC: 31.2 g/dL (ref 30.0–36.0)
MCV: 82.7 fL (ref 78.0–100.0)
Platelets: 233 10*3/uL (ref 150–400)
RBC: 5.54 MIL/uL (ref 4.22–5.81)
RDW: 14.6 % (ref 11.5–15.5)
WBC: 8.7 10*3/uL (ref 4.0–10.5)

## 2015-06-01 LAB — PROTIME-INR
INR: 1.11 (ref 0.00–1.49)
Prothrombin Time: 14.5 seconds (ref 11.6–15.2)

## 2015-06-01 LAB — BASIC METABOLIC PANEL
Anion gap: 8 (ref 5–15)
BUN: 22 mg/dL — AB (ref 6–20)
CALCIUM: 9.3 mg/dL (ref 8.9–10.3)
CO2: 32 mmol/L (ref 22–32)
CREATININE: 1.61 mg/dL — AB (ref 0.61–1.24)
Chloride: 102 mmol/L (ref 101–111)
GFR calc Af Amer: 59 mL/min — ABNORMAL LOW (ref >60)
GFR, EST NON AFRICAN AMERICAN: 51 mL/min — AB (ref >60)
GLUCOSE: 92 mg/dL (ref 65–99)
Potassium: 3.8 mmol/L (ref 3.5–5.1)
Sodium: 142 mmol/L (ref 135–145)

## 2015-06-01 LAB — POCT I-STAT 3, ART BLOOD GAS (G3+)
ACID-BASE EXCESS: 3 mmol/L — AB (ref 0.0–2.0)
BICARBONATE: 29.9 meq/L — AB (ref 20.0–24.0)
O2 SAT: 71 %
TCO2: 32 mmol/L (ref 0–100)
pCO2 arterial: 54 mmHg — ABNORMAL HIGH (ref 35.0–45.0)
pH, Arterial: 7.351 (ref 7.350–7.450)
pO2, Arterial: 40 mmHg — ABNORMAL LOW (ref 80.0–100.0)

## 2015-06-01 SURGERY — RIGHT/LEFT HEART CATH AND CORONARY ANGIOGRAPHY

## 2015-06-01 MED ORDER — HEPARIN (PORCINE) IN NACL 2-0.9 UNIT/ML-% IJ SOLN
INTRAMUSCULAR | Status: AC
  Filled 2015-06-01: qty 1000

## 2015-06-01 MED ORDER — SODIUM CHLORIDE 0.9% FLUSH: 3.0000 mL | INTRAVENOUS | Status: DC | PRN

## 2015-06-01 MED ORDER — LABETALOL HCL 5 MG/ML IV SOLN
INTRAVENOUS | Status: AC
  Filled 2015-06-01: qty 4

## 2015-06-01 MED ORDER — IOPAMIDOL (ISOVUE-370) INJECTION 76%
INTRAVENOUS | Status: AC
  Filled 2015-06-01: qty 100

## 2015-06-01 MED ORDER — MIDAZOLAM HCL 2 MG/2ML IJ SOLN
INTRAMUSCULAR | Status: AC
  Filled 2015-06-01: qty 2

## 2015-06-01 MED ORDER — HYDROCODONE-ACETAMINOPHEN 5-325 MG PO TABS
1.0000 | ORAL_TABLET | ORAL | Status: DC | PRN
  Administered 2015-06-01 – 2015-06-02 (×3): 1 via ORAL
  Filled 2015-06-01 (×3): qty 1

## 2015-06-01 MED ORDER — SODIUM CHLORIDE 0.9% FLUSH
3.0000 mL | Freq: Two times a day (BID) | INTRAVENOUS | Status: DC
  Administered 2015-06-01 – 2015-06-02 (×2): 3 mL via INTRAVENOUS

## 2015-06-01 MED ORDER — FUROSEMIDE 10 MG/ML IJ SOLN
40.0000 mg | Freq: Once | INTRAMUSCULAR | Status: AC
  Administered 2015-06-01: 40 mg via INTRAVENOUS
  Filled 2015-06-01: qty 4

## 2015-06-01 MED ORDER — LIDOCAINE HCL (PF) 1 % IJ SOLN
INTRAMUSCULAR | Status: DC | PRN
  Administered 2015-06-01: 2 mL via SUBCUTANEOUS
  Administered 2015-06-01: 5 mL via SUBCUTANEOUS

## 2015-06-01 MED ORDER — FENTANYL CITRATE (PF) 100 MCG/2ML IJ SOLN
INTRAMUSCULAR | Status: AC
  Filled 2015-06-01: qty 2

## 2015-06-01 MED ORDER — SODIUM CHLORIDE 0.9 % WEIGHT BASED INFUSION
1.0000 mL/kg/h | INTRAVENOUS | Status: AC
  Administered 2015-06-01: 1 mL/kg/h via INTRAVENOUS

## 2015-06-01 MED ORDER — MIDAZOLAM HCL 2 MG/2ML IJ SOLN
INTRAMUSCULAR | Status: DC | PRN
  Administered 2015-06-01: 2 mg via INTRAVENOUS

## 2015-06-01 MED ORDER — IOPAMIDOL (ISOVUE-370) INJECTION 76%
INTRAVENOUS | Status: DC | PRN
  Administered 2015-06-01: 95 mL

## 2015-06-01 MED ORDER — LABETALOL HCL 5 MG/ML IV SOLN
10.0000 mg | Freq: Once | INTRAVENOUS | Status: AC
  Administered 2015-06-01: 10 mg via INTRAVENOUS

## 2015-06-01 MED ORDER — HEPARIN SODIUM (PORCINE) 1000 UNIT/ML IJ SOLN
INTRAMUSCULAR | Status: DC | PRN
  Administered 2015-06-01: 5000 [IU] via INTRAVENOUS

## 2015-06-01 MED ORDER — VERAPAMIL HCL 2.5 MG/ML IV SOLN
INTRAVENOUS | Status: AC
  Filled 2015-06-01: qty 2

## 2015-06-01 MED ORDER — LIDOCAINE HCL (PF) 1 % IJ SOLN
INTRAMUSCULAR | Status: AC
  Filled 2015-06-01: qty 30

## 2015-06-01 MED ORDER — HEPARIN (PORCINE) IN NACL 2-0.9 UNIT/ML-% IJ SOLN
INTRAMUSCULAR | Status: DC | PRN
  Administered 2015-06-01: 16:00:00 via INTRA_ARTERIAL

## 2015-06-01 MED ORDER — FENTANYL CITRATE (PF) 100 MCG/2ML IJ SOLN
INTRAMUSCULAR | Status: DC | PRN
  Administered 2015-06-01: 25 ug via INTRAVENOUS

## 2015-06-01 MED ORDER — HEPARIN (PORCINE) IN NACL 2-0.9 UNIT/ML-% IJ SOLN
INTRAMUSCULAR | Status: DC | PRN
  Administered 2015-06-01: 1000 mL

## 2015-06-01 MED ORDER — HEPARIN SODIUM (PORCINE) 1000 UNIT/ML IJ SOLN
INTRAMUSCULAR | Status: AC
  Filled 2015-06-01: qty 1

## 2015-06-01 MED ORDER — SODIUM CHLORIDE 0.9 % IV SOLN: 250.0000 mL | INTRAVENOUS | Status: DC | PRN

## 2015-06-01 SURGICAL SUPPLY — 15 items
CATH BALLN WEDGE 5F 110CM (CATHETERS) ×2 IMPLANT
CATH INFINITI 5FR ANG PIGTAIL (CATHETERS) ×2 IMPLANT
CATH OPTITORQUE TIG 4.0 5F (CATHETERS) ×2 IMPLANT
COVER PRB 48X5XTLSCP FOLD TPE (BAG) ×1 IMPLANT
COVER PROBE 5X48 (BAG) ×1
DEVICE RAD COMP TR BAND LRG (VASCULAR PRODUCTS) ×2 IMPLANT
GLIDESHEATH SLEND A-KIT 6F 22G (SHEATH) ×2 IMPLANT
GLIDESHEATH SLEND SS 6F .021 (SHEATH) ×2 IMPLANT
KIT HEART LEFT (KITS) ×2 IMPLANT
PACK CARDIAC CATHETERIZATION (CUSTOM PROCEDURE TRAY) ×2 IMPLANT
SHEATH FAST CATH BRACH 5F 5CM (SHEATH) ×2 IMPLANT
SYR MEDRAD MARK V 150ML (SYRINGE) ×2 IMPLANT
TRANSDUCER W/STOPCOCK (MISCELLANEOUS) ×2 IMPLANT
TUBING CIL FLEX 10 FLL-RA (TUBING) ×2 IMPLANT
WIRE SAFE-T 1.5MM-J .035X260CM (WIRE) ×2 IMPLANT

## 2015-06-01 NOTE — Research (Signed)
CADLAD Informed Consent   Subject Name: [name]  Subject met inclusion and exclusion criteria.  The informed consent form, study requirements and expectations were reviewed with the subject and questions and concerns were addressed prior to the signing of the consent form.  The subject verbalized understanding of the trail requirements.  The subject agreed to participate in the CADLAD trial and signed the informed consent.  The informed consent was obtained prior to performance of any protocol-specific procedures for the subject.  A copy of the signed informed consent was given to the subject and a copy was placed in the subject's medical record.  [name] 06/01/2015, 13:50

## 2015-06-01 NOTE — Progress Notes (Signed)
     SUBJECTIVE: NO pain or dyspnea.   BP 120/100 mmHg  Pulse 76  Temp(Src) 98.7 F (37.1 C) (Oral)  Resp 18  Ht 5' 10.08" (1.78 m)  Wt 247 lb 8 oz (112.265 kg)  BMI 35.43 kg/m2  SpO2 100%  Intake/Output Summary (Last 24 hours) at 06/01/15 0730 Last data filed at 05/31/15 1700  Gross per 24 hour  Intake    480 ml  Output   2250 ml  Net  -1770 ml    PHYSICAL EXAM General: Well developed, well nourished, in no acute distress. Alert and oriented x 3.  Psych:  Good affect, responds appropriately Neck: No JVD. No masses noted.  Lungs: Clear bilaterally with no wheezes or rhonci noted.  Heart: RRR with no murmurs noted. Abdomen: Bowel sounds are present. Soft, non-tender.  Extremities: No lower extremity edema.   LABS: Basic Metabolic Panel:  Recent Labs  85/90/93 0624 05/31/15 0310 06/01/15 0420  NA 143 142 142  K 3.6 3.5 3.8  CL 102 100* 102  CO2 34* 32 32  GLUCOSE 83 90 92  BUN 17 21* 22*  CREATININE 1.66* 1.68* 1.61*  CALCIUM 9.2 9.3 9.3  MG 2.0  --   --    CBC:  Recent Labs  05/29/15 2237 05/30/15 0624  WBC 9.3 9.8  NEUTROABS 6.5 6.6  HGB 13.0 14.2  HCT 41.1 44.9  MCV 82.4 82.5  PLT 218 216   Cardiac Enzymes:  Recent Labs  05/30/15 0624 05/30/15 1104 05/30/15 1753  TROPONINI 0.12* 0.11* 0.11*    Current Meds: . aspirin  81 mg Oral Pre-Cath  . aspirin EC  81 mg Oral Daily  . carvedilol  12.5 mg Oral BID WC  . enoxaparin (LOVENOX) injection  55 mg Subcutaneous Q24H  . furosemide  40 mg Oral Daily  . hydrALAZINE  25 mg Oral 3 times per day  . lisinopril  5 mg Oral Daily  . potassium chloride  10 mEq Oral Daily  . sodium chloride flush  3 mL Intravenous Q12H  . sodium chloride flush  3 mL Intravenous Q12H     ASSESSMENT AND PLAN:  1. Acute systolic CHF/Cardiomyopathy: His LV systolic function is reduced. He has diuresed well with Lasix. Plan cath today to exclude CAD, assess filling pressures. Continue ACE-I and BB.   2.  Accelerated HTN: BP better controlled on current therapy.   3. Elevated Troponin: Possibly due to CHF. Will plan cath today to exclude CAD  4. Stage 2 CKD: Renal function stable. Will follow closely post cath  5. Tobacco Abuse: smoking cessation advised.  Pt is going for cath today. I have also evaluated him today for his BP, renal disease and discussed his cardiomyopathy.   Nevaeha Finerty  3/30/20177:30 AM

## 2015-06-01 NOTE — Interval H&P Note (Signed)
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    The various methods of treatment have been discussed with the patient and family. After consideration of risks, benefits and other options for treatment, the patient has consented to  Procedure(s): Right/Left Heart Cath and Coronary Angiography (N/A) with possible Percutaneous Coronary Intervention as a surgical intervention .  The patient's history has been reviewed, patient examined, no change in status, stable for surgery.  I have reviewed the patient's chart and labs.  Questions were answered to the patient's satisfaction.    Cath Lab Visit (complete for each Cath Lab visit)  Clinical Evaluation Leading to the Procedure:   ACS: No.  Non-ACS:    Anginal Classification: CCS III - CHF Sx  Anti-ischemic medical therapy: Minimal Therapy (1 class of medications)  Non-Invasive Test Results: Intermediate-risk stress test findings: cardiac mortality 1-3%/year - EF ~30-35% by Echo  Prior CABG: No previous CABG   AUC FOR R&LHC -- if CAD found, will do AUC for PCI ad hoc. Cardiomyopathies (Right and Left Heart Catheterization OR  Right Heart Catheterization Alone With/Without Left Ventriculography and Coronary Angiography)   Patient Information:    Known or suspected cardiomyopathy with or without heart failure  AUC Score:   A (7)   Indication:   93   HARDING, DAVID W

## 2015-06-01 NOTE — Progress Notes (Signed)
Patient refused to watch Cardiac Catheterization Education video.  Patient stated " the doc already talked to  Me.  RN will continue to monitor patient

## 2015-06-01 NOTE — Progress Notes (Signed)
Site area: Right groin a 5 french venous sheath was removed  Site Prior to Removal:  Level 0  Pressure Applied For 15 MINUTES    Bedrest  beginning at 1710p  Manual:   Yes.    Patient Status During Pull:  stable  Post Pull Groin Site:  Level 0  Post Pull Instructions Given:  Yes.    Post Pull Pulses Present:  Yes.    Dressing Applied:  Yes.    Comments:  BP elevated 166/129  Labetalol 10 mg IV given per MD order

## 2015-06-01 NOTE — Progress Notes (Signed)
Patient has refused to be clipped/prepped for scheduled cardiac cath today. Patient stated " do it later, im sleeping" RN educated patient on importance of being prepped for scheduled procedure, patient continued to refuse.  RN will continue to monitor patient.

## 2015-06-01 NOTE — Progress Notes (Addendum)
[facility] PROGRESS NOTE  [name] IDP:[id number] DOB: March 27, 1970 DOA: 05/30/2015 PCP: Default, Provider, MD  45 year old obese male with hypertension, MS, CK D stage III presenting with acute on chronic systolic CHF  Assessment/Plan:  Acute on chronic congestive systolic CHF Diuresing well with Lasix. Negative balance of >90 L. No further dyspnea or leg edema. 2-D echo shows EF of 25 and 30% with diffuse hypokinesis. Seen by cardiology with plan on cardiac cath. Continue aspirin, beta blocker, Imdur, hydralazine. Added low dose lisinopril.   Mildly elevated troponin Suspected dementia. Continue aspirin, beta blocker, added Imdur.  Chronic and disease stage III Stable.  Essential hypertension with hypertensive urgency on presentation Noncompliance with medications. Patient has been noncompliant to most of his medications while he's been here. Counseled on medication adherence. Continue Coreg, hydralazine and Imdur.  History of MS Asymptomatic. Follow-up with his neurologist as outpatient.  DVT prophylaxis: Subcutaneous Lovenox Diet: Nothing by mouth for cardiac cath  Code Status: Full code Family Communication: None at bedside Disposition Plan: Cardiac cath today. Possible home in 1-2 days   Consultants:  Cardiology  Procedures:  2-D echo  Cardiac cath  Antibiotics:  None  HPI/Subjective: Seen and examined. Denies any shortness of breath  Objective: Filed Vitals:   06/01/15 1159 06/01/15 1352  BP: 141/99 150/90  Pulse: 71   Temp: 98.4 F (36.9 C)   Resp: 18     Intake/Output Summary (Last 24 hours) at 06/01/15 1435 Last data filed at 06/01/15 1100  Gross per 24 hour  Intake      0 ml  Output   2650 ml  Net  -2650 ml   Filed Weights   05/30/15 0537 05/31/15 0359 06/01/15 0325  Weight: 115.259 kg (254 lb 1.6 oz) 112.175 kg (247 lb 4.8 oz) 112.265 kg (247 lb 8 oz)    Exam:   General:  Middle aged obese male not in distress  HEENT:  No pallor, moist mucosa    chest: Ear bilaterally  CVS: Normal S1 and S2, no murmurs rub or gallop  GI: soft, nondistended, nontender  Muscle skeletal: No edema   Data Reviewed: Basic Metabolic Panel:  Recent Labs Lab 05/29/15 2237 05/30/15 0624 05/31/15 0310 06/01/15 0420  NA 140 143 142 142  K 3.6 3.6 3.5 3.8  CL 106 102 100* 102  CO2 26 34* 32 32  GLUCOSE 106* 83 90 92  BUN 17 17 21* 22*  CREATININE 1.59* 1.66* 1.68* 1.61*  CALCIUM 9.0 9.2 9.3 9.3  MG  --  2.0  --   --    Liver Function Tests:  Recent Labs Lab 05/29/15 2237 05/30/15 0624  AST 26 29  ALT 43 46  ALKPHOS 42 49  BILITOT 0.8 0.7  PROT 6.4* 6.8  ALBUMIN 3.5 3.8   No results for input(s): LIPASE, AMYLASE in the last 168 hours. No results for input(s): AMMONIA in the last 168 hours. CBC:  Recent Labs Lab 05/29/15 2237 05/30/15 0624  WBC 9.3 9.8  NEUTROABS 6.5 6.6  HGB 13.0 14.2  HCT 41.1 44.9  MCV 82.4 82.5  PLT 218 216   Cardiac Enzymes:  Recent Labs Lab 05/30/15 0624 05/30/15 1104 05/30/15 1753  TROPONINI 0.12* 0.11* 0.11*   BNP (last 3 results)  Recent Labs  05/29/15 2237  BNP 1013.4*    ProBNP (last 3 results) No results for input(s): PROBNP in the last 8760 hours.  CBG: No results for input(s): GLUCAP in the last 168 hours.  No results  found for this or any previous visit (from the past 240 hour(s)).   Studies: No results found.  Scheduled Meds: . aspirin  81 mg Oral Pre-Cath  . aspirin EC  81 mg Oral Daily  . carvedilol  12.5 mg Oral BID WC  . enoxaparin (LOVENOX) injection  55 mg Subcutaneous Q24H  . furosemide  40 mg Oral Daily  . hydrALAZINE  25 mg Oral 3 times per day  . lisinopril  5 mg Oral Daily  . potassium chloride  10 mEq Oral Daily  . sodium chloride flush  3 mL Intravenous Q12H  . sodium chloride flush  3 mL Intravenous Q12H   Continuous Infusions:     Time spent: 25 minutes    [name]  [facility] Pager [phone].  If 7PM-7AM, please contact night-coverage at [email or web address], password [facility] 06/01/2015, 2:35 PM  LOS: 2 days

## 2015-06-01 NOTE — H&P (View-Only) (Signed)
     SUBJECTIVE: NO pain or dyspnea.   BP 120/100 mmHg  Pulse 76  Temp(Src) 98.7 F (37.1 C) (Oral)  Resp 18  Ht 5' 10.08" (1.78 m)  Wt 247 lb 8 oz (112.265 kg)  BMI 35.43 kg/m2  SpO2 100%  Intake/Output Summary (Last 24 hours) at 06/01/15 0730 Last data filed at 05/31/15 1700  Gross per 24 hour  Intake    480 ml  Output   2250 ml  Net  -1770 ml    PHYSICAL EXAM General: Well developed, well nourished, in no acute distress. Alert and oriented x 3.  Psych:  Good affect, responds appropriately Neck: No JVD. No masses noted.  Lungs: Clear bilaterally with no wheezes or rhonci noted.  Heart: RRR with no murmurs noted. Abdomen: Bowel sounds are present. Soft, non-tender.  Extremities: No lower extremity edema.   LABS: Basic Metabolic Panel:  Recent Labs  94/17/40 0624 05/31/15 0310 06/01/15 0420  NA 143 142 142  K 3.6 3.5 3.8  CL 102 100* 102  CO2 34* 32 32  GLUCOSE 83 90 92  BUN 17 21* 22*  CREATININE 1.66* 1.68* 1.61*  CALCIUM 9.2 9.3 9.3  MG 2.0  --   --    CBC:  Recent Labs  05/29/15 2237 05/30/15 0624  WBC 9.3 9.8  NEUTROABS 6.5 6.6  HGB 13.0 14.2  HCT 41.1 44.9  MCV 82.4 82.5  PLT 218 216   Cardiac Enzymes:  Recent Labs  05/30/15 0624 05/30/15 1104 05/30/15 1753  TROPONINI 0.12* 0.11* 0.11*    Current Meds: . aspirin  81 mg Oral Pre-Cath  . aspirin EC  81 mg Oral Daily  . carvedilol  12.5 mg Oral BID WC  . enoxaparin (LOVENOX) injection  55 mg Subcutaneous Q24H  . furosemide  40 mg Oral Daily  . hydrALAZINE  25 mg Oral 3 times per day  . lisinopril  5 mg Oral Daily  . potassium chloride  10 mEq Oral Daily  . sodium chloride flush  3 mL Intravenous Q12H  . sodium chloride flush  3 mL Intravenous Q12H     ASSESSMENT AND PLAN:  1. Acute systolic CHF/Cardiomyopathy: His LV systolic function is reduced. He has diuresed well with Lasix. Plan cath today to exclude CAD, assess filling pressures. Continue ACE-I and BB.   2.  Accelerated HTN: BP better controlled on current therapy.   3. Elevated Troponin: Possibly due to CHF. Will plan cath today to exclude CAD  4. Stage 2 CKD: Renal function stable. Will follow closely post cath  5. Tobacco Abuse: smoking cessation advised.  Pt is going for cath today. I have also evaluated him today for his BP, renal disease and discussed his cardiomyopathy.   Dodd Schmid  3/30/20177:30 AM

## 2015-06-01 NOTE — Progress Notes (Signed)
Call received from Dr. Gonzella Lex.  Per Dr. Gonzella Lex, OK to give hydralazine at this time.  Will cont to monitor BP.

## 2015-06-02 ENCOUNTER — Encounter (HOSPITAL_COMMUNITY): Payer: Self-pay | Admitting: Cardiology

## 2015-06-02 DIAGNOSIS — I429 Cardiomyopathy, unspecified: Secondary | ICD-10-CM

## 2015-06-02 LAB — BASIC METABOLIC PANEL
ANION GAP: 10 (ref 5–15)
BUN: 20 mg/dL (ref 6–20)
CALCIUM: 9 mg/dL (ref 8.9–10.3)
CO2: 23 mmol/L (ref 22–32)
Chloride: 106 mmol/L (ref 101–111)
Creatinine, Ser: 1.58 mg/dL — ABNORMAL HIGH (ref 0.61–1.24)
GFR calc Af Amer: >60 mL/min (ref >60)
GFR calc non Af Amer: 52 mL/min — ABNORMAL LOW (ref >60)
GLUCOSE: 92 mg/dL (ref 65–99)
Potassium: 4.3 mmol/L (ref 3.5–5.1)
Sodium: 139 mmol/L (ref 135–145)

## 2015-06-02 MED ORDER — FUROSEMIDE 40 MG PO TABS: 40.0000 mg | ORAL_TABLET | Freq: Every day | ORAL | Status: DC

## 2015-06-02 MED ORDER — CARVEDILOL 12.5 MG PO TABS: 12.5000 mg | ORAL_TABLET | Freq: Two times a day (BID) | ORAL | Status: DC

## 2015-06-02 MED ORDER — LISINOPRIL 5 MG PO TABS: 5.0000 mg | ORAL_TABLET | Freq: Every day | ORAL | Status: DC

## 2015-06-02 MED ORDER — ASPIRIN 81 MG PO TBEC: 81.0000 mg | DELAYED_RELEASE_TABLET | Freq: Every day | ORAL | Status: AC

## 2015-06-02 MED ORDER — HYDRALAZINE HCL 25 MG PO TABS: 25.0000 mg | ORAL_TABLET | Freq: Three times a day (TID) | ORAL | Status: DC

## 2015-06-02 NOTE — Progress Notes (Addendum)
     SUBJECTIVE: Pt feels better today. No pain or dyspnea.   BP 131/78 mmHg  Pulse 80  Temp(Src) 98.2 F (36.8 C) (Oral)  Resp 18  Ht 5' 10.08" (1.78 m)  Wt 246 lb 6.4 oz (111.766 kg)  BMI 35.28 kg/m2  SpO2 99%  Intake/Output Summary (Last 24 hours) at 06/02/15 1111 Last data filed at 06/02/15 0800  Gross per 24 hour  Intake    840 ml  Output   2450 ml  Net  -1610 ml    PHYSICAL EXAM General: Well developed, well nourished, in no acute distress. Alert and oriented x 3.  Psych:  Good affect, responds appropriately Neck: No JVD. No masses noted.  Lungs: Clear bilaterally with no wheezes or rhonci noted.  Heart: RRR with no murmurs noted. Abdomen: Bowel sounds are present. Soft, non-tender.  Extremities: No lower extremity edema.   LABS: Basic Metabolic Panel:  Recent Labs  80/32/12 0420 06/02/15 0529  NA 142 139  K 3.8 4.3  CL 102 106  CO2 32 23  GLUCOSE 92 92  BUN 22* 20  CREATININE 1.61* 1.58*  CALCIUM 9.3 9.0   CBC:  Recent Labs  06/01/15 1910  WBC 8.7  HGB 14.3  HCT 45.8  MCV 82.7  PLT 233   Cardiac Enzymes:  Recent Labs  05/30/15 1753  TROPONINI 0.11*   Current Meds: . aspirin EC  81 mg Oral Daily  . carvedilol  12.5 mg Oral BID WC  . enoxaparin (LOVENOX) injection  55 mg Subcutaneous Q24H  . furosemide  40 mg Oral Daily  . hydrALAZINE  25 mg Oral 3 times per day  . lisinopril  5 mg Oral Daily  . potassium chloride  10 mEq Oral Daily  . sodium chloride flush  3 mL Intravenous Q12H  . sodium chloride flush  3 mL Intravenous Q12H     ASSESSMENT AND PLAN:  1. Acute systolic CHF/non-ischemic Cardiomyopathy: His LV systolic function is reduced. No CAD. Likely due to HTN. Filling pressures elevated by cath yesterday. He has diuresed well with Lasix. Will continue po Lasix today. Continue ACE-I and beta blocker. Repeat echo in 3 months.   2. Accelerated HTN: BP controlled on current therapy.   3. Elevated Troponin: Likely due to CHF.  No CAD noted on cath yesterday  4. Stage 2 CKD: Renal function stable post cath  5. Tobacco Abuse: smoking cessation advised.   Ready for d/c from cardiac perspective. We can arrange f/u in our office in one week.   MCALHANY,CHRISTOPHER  3/31/201711:11 AM

## 2015-06-02 NOTE — Care Management Note (Signed)
Case Management Note Donn Pierini RN, BSN Unit 2W-Case Manager [phone]  Patient Details  Name: [name] MRN: [id number] Date of Birth: [birth year]  Subjective/Objective:      Pt admitted with CHF            Action/Plan: PTA pt lived at home- has Medicaid - no PCP- spoke with pt at bedside- info given to pt on [facility]- attempted to call for f/u appointment with clinic- however they were not taking appointments today- asked for pt to call on Monday to schedule a hospital f/u appointment- info given to pt- pt also requested info on other Medicaid PCP in area- list provided to pt on [facility] PCP accepting Medicaid. No further needs noted at this time- CM to follow  Expected Discharge Date:                  Expected Discharge Plan:  Home/Self Care  In-House Referral:     Discharge planning Services  CM Consult, [facility]igent [facility]  Post Acute Care Choice:    Choice offered to:     DME Arranged:    DME Agency:     HH Arranged:    HH Agency:     Status of Service:  In process, will continue to follow  Medicare Important Message Given:    Date Medicare IM Given:    Medicare IM give by:    Date Additional Medicare IM Given:    Additional Medicare Important Message give by:     If discussed at Long Length of Stay Meetings, dates discussed:    Additional Comments:  Darrold Span, RN 06/02/2015, 2:27 PM

## 2015-06-02 NOTE — Progress Notes (Signed)
Received notification from CCMD that pt had a 16 beat run of Vtach.  RN at bedside at the time.  Pt is asymptomatic, will continue to monitor

## 2015-06-02 NOTE — Progress Notes (Signed)
Pt discharged to home in care of brother and dad.  He expresses understanding of his discharge information and understands importance of keeping/scheduling follow up appts.

## 2015-06-02 NOTE — Discharge Instructions (Signed)

## 2015-06-02 NOTE — Discharge Summary (Signed)
Physician Discharge Summary  [name] TKZ:[id number] DOB: Aug 07, 1970 DOA: 05/30/2015  PCP: Default, Provider, MD  Admit date: 05/30/2015 Discharge date: 06/02/2015  Time spent: 35 minutes  Recommendations for Outpatient Follow-up:  Discharge home with outpatient follow-up with cardiology (will call with appointment) Patient should call [facility] on 06/04/25 to establish an appointment  Discharge Diagnoses:  Principal Problem:   Acute on chronic combined systolic and diastolic CHF (congestive heart failure) (HCC)   Active Problems:   Hypertensive urgency   MS (multiple sclerosis) (HCC)   CKD (chronic kidney disease) stage 3, GFR 30-59 ml/min   CHF (congestive heart failure) (HCC)   Congestive dilated cardiomyopathy (HCC)   Elevated troponin   Noncompliance with medication treatment due to intermittent use of medication   Discharge Condition: Fair  Diet recommendation: Heart healthy  Filed Weights   05/31/15 0359 06/01/15 0325 06/02/15 0508  Weight: 112.175 kg (247 lb 4.8 oz) 112.265 kg (247 lb 8 oz) 111.766 kg (246 lb 6.4 oz)    History of present illness:  Please refer to admission H&P for details, in brief, 45 year old obese male with hypertension, MS, CK D stage III, noncompliance with medications presenting with acute on chronic systolic CHF.  Hospital Course:  Acute on chronic congestive systolic CHF Diuresed well with Lasix. Negative balance of > 10 L. No further dyspnea or leg edema. 2-D echo shows EF of 25 -30% with diffuse hypokinesis. Seen by cardiology and patient underwent cardiac cath which showed markedly depressed EF of 25 -35% with clean coronaries..  Patient placed on aspirin, Coreg and hydralazine. Added low dose lisinopril. Prescriptions provided and emphasized on dietary and medication adherence. Cardiology will arrange outpatient follow-up.    Mildly elevated troponin Likely demand ischemia.. Continue  aspirin, beta blocker.  Chronic and disease stage III Stable.  Essential hypertension with hypertensive urgency on presentation Due to noncompliance. Counseled on medication adherence. Continue Coreg, hydralazine and low-dose lisinopril. Blood pressure remains stable   History of MS Asymptomatic. Follow-up with his neurologist as outpatient.  Patient clinically stable and can be discharged home with outpatient cardiology follow-up. Have referred [facility] to [facility] to establish primary care.    Code Status: Full code Family Communication: None at bedside Disposition Plan: Home   Consultants:  Cardiology  Procedures:  2-D echo  Cardiac cath  Antibiotics:  None    Discharge Exam: Filed Vitals:   06/02/15 0508 06/02/15 1315  BP: 131/78 135/85  Pulse: 80 75  Temp: 98.2 F (36.8 C) 98 F (36.7 C)  Resp: 18 18    General: Middle aged obese male not in distress HEENT: No pallor, moist mucosa Chest: Clear bilaterally, no added sounds CVS: Normal S1 and S2, no murmurs rub or gallop GI: Soft, nondistended, nontender, bowel sounds present Musculoskeletal: Warm, no edema CNS: Alert and oriented  Discharge Instructions    Current Discharge Medication List    START taking these medications   Details  aspirin EC 81 MG EC tablet Take 1 tablet (81 mg total) by mouth daily. Qty: 30 tablet, Refills: 0    carvedilol (COREG) 12.5 MG tablet Take 1 tablet (12.5 mg total) by mouth 2 (two) times daily with a meal. Qty: 60 tablet, Refills: 0    furosemide (LASIX) 40 MG tablet Take 1 tablet (40 mg total) by mouth daily. Qty: 30 tablet, Refills: 0    hydrALAZINE (APRESOLINE) 25 MG tablet Take 1 tablet (25 mg total) by mouth every 8 (eight) hours.  Qty: 90 tablet, Refills: 0      CONTINUE these medications which have CHANGED   Details  lisinopril (PRINIVIL,ZESTRIL) 5 MG tablet Take 1 tablet (5 mg total) by mouth daily. Qty: 30 tablet, Refills: 0       STOP taking these medications     lisinopril-hydrochlorothiazide (PRINZIDE,ZESTORETIC) 10-12.5 MG tablet      gabapentin (NEURONTIN) 300 MG capsule      hydrochlorothiazide (HYDRODIURIL) 12.5 MG tablet        No Known Allergies Follow-up Information    Follow up with [facility].   Why:  Patient needs to call Monday 4/3- to schedule Hospital f/u appointment   Contact information:   [address] [place] [address] [phone]      Follow up with Verne Carrow, MD.   Specialty:  Cardiology   Why:  office with call with appt   Contact information:   [address] [place] [address] [phone]        The results of significant diagnostics from this hospitalization (including imaging, microbiology, ancillary and laboratory) are listed below for reference.    Significant Diagnostic Studies: Dg Chest 2 View  05/29/2015  CLINICAL DATA:  Acute onset of shortness of breath. Bilateral foot and leg swelling. Initial encounter. EXAM: CHEST  2 VIEW COMPARISON:  None. FINDINGS: The lungs are well-aerated. Vascular congestion is noted. Mild bibasilar atelectasis is noted. There is no evidence of pleural effusion or pneumothorax. The heart is mildly enlarged. No acute osseous abnormalities are seen. IMPRESSION: Vascular congestion and mild cardiomegaly. Mild bibasilar atelectasis noted. Electronically Signed   By: Roanna Raider M.D.   On: 05/29/2015 23:12    Microbiology: No results found for this or any previous visit (from the past 240 hour(s)).   Labs: Basic Metabolic Panel:  Recent Labs Lab 05/29/15 2237 05/30/15 0624 05/31/15 0310 06/01/15 0420 06/02/15 0529  NA 140 143 142 142 139  K 3.6 3.6 3.5 3.8 4.3  CL 106 102 100* 102 106  CO2 26 34* 32 32 23  GLUCOSE 106* 83 90 92 92  BUN 17 17 21* 22* 20  CREATININE 1.59* 1.66* 1.68* 1.61* 1.58*  CALCIUM 9.0 9.2 9.3 9.3 9.0  MG  --  2.0  --   --    --    Liver Function Tests:  Recent Labs Lab 05/29/15 2237 05/30/15 0624  AST 26 29  ALT 43 46  ALKPHOS 42 49  BILITOT 0.8 0.7  PROT 6.4* 6.8  ALBUMIN 3.5 3.8   No results for input(s): LIPASE, AMYLASE in the last 168 hours. No results for input(s): AMMONIA in the last 168 hours. CBC:  Recent Labs Lab 05/29/15 2237 05/30/15 0624 06/01/15 1910  WBC 9.3 9.8 8.7  NEUTROABS 6.5 6.6  --   HGB 13.0 14.2 14.3  HCT 41.1 44.9 45.8  MCV 82.4 82.5 82.7  PLT 218 216 233   Cardiac Enzymes:  Recent Labs Lab 05/30/15 0624 05/30/15 1104 05/30/15 1753  TROPONINI 0.12* 0.11* 0.11*   BNP: BNP (last 3 results)  Recent Labs  05/29/15 2237  BNP 1013.4*    ProBNP (last 3 results) No results for input(s): PROBNP in the last 8760 hours.  CBG: No results for input(s): GLUCAP in the last 168 hours.     Signed:  Eddie North MD.  [facility] 06/02/2015, 2:41 PM

## 2015-06-07 ENCOUNTER — Ambulatory Visit (INDEPENDENT_AMBULATORY_CARE_PROVIDER_SITE_OTHER): Payer: Medicaid Other | Admitting: Cardiovascular Disease

## 2015-06-07 ENCOUNTER — Telehealth: Payer: Self-pay | Admitting: Cardiovascular Disease

## 2015-06-07 ENCOUNTER — Encounter: Payer: Self-pay | Admitting: Cardiovascular Disease

## 2015-06-07 VITALS — BP 142/99 | HR 65 | Ht 70.0 in | Wt 254.0 lb

## 2015-06-07 DIAGNOSIS — I428 Other cardiomyopathies: Secondary | ICD-10-CM

## 2015-06-07 DIAGNOSIS — M79601 Pain in right arm: Secondary | ICD-10-CM | POA: Diagnosis not present

## 2015-06-07 DIAGNOSIS — I10 Essential (primary) hypertension: Secondary | ICD-10-CM

## 2015-06-07 DIAGNOSIS — I5022 Chronic systolic (congestive) heart failure: Secondary | ICD-10-CM

## 2015-06-07 DIAGNOSIS — I429 Cardiomyopathy, unspecified: Secondary | ICD-10-CM

## 2015-06-07 MED ORDER — HYDRALAZINE HCL 50 MG PO TABS
50.0000 mg | ORAL_TABLET | Freq: Three times a day (TID) | ORAL | Status: DC
Start: 2015-06-07 — End: 2015-06-26

## 2015-06-07 MED ORDER — OXYCODONE-ACETAMINOPHEN 5-325 MG PO TABS: 1.0000 | ORAL_TABLET | Freq: Four times a day (QID) | ORAL | Status: DC | PRN

## 2015-06-07 NOTE — Patient Instructions (Signed)
Medication Instructions:  Your physician has recommended you make the following change in your medication:  Increase hydralazine to 50 mg by mouth three times daily.   Labwork: none  Testing/Procedures: none  Follow-Up: Keep scheduled follow up with Parker Newcomer, PA on 06/26/15  Any Other Special Instructions Will Be Listed Below (If Applicable).     If you need a refill on your cardiac medications before your next appointment, please call your pharmacy.

## 2015-06-07 NOTE — Telephone Encounter (Signed)
Spoke with pt. He reports his arm where cath was done on 06/01/15 is really bothering him today. He describes a constant throbbing pain which increased in severity a few hours ago.  States he had some pain after procedure in hospital and was treated with Vicodin.  Pain improved prior to discharge and he describes as mild upon leaving hospital. Has had mild pain at cath site since discharge. Pain increased in severity today.  He has not taken anything for this pain. He states site looks OK.  No redness or bruising. Mild swelling at site which has been present since procedure.  States fingers on this hand felt cooler this morning and slightly discolored. He elevated arm and took a nap.  Upon waking fingers are still slightly different in color than fingers on other hand.  Hand is now the same temperature as the rest of his body.  I told pt he could take tylenol as needed and to follow label instructions.  Will forward to Dr. Clifton James to see if any testing indicated.

## 2015-06-07 NOTE — Progress Notes (Signed)
Chief Complaint  Patient presents with  . Follow-up    pain in cath area and pain in arm/hand on right side     History of Present Illness: 45 yo male with history of HTN admitted to [facility] March 2017 with hypertensive urgency, acute CHF. He was diuresed and BP meds restarted. He had been out of meds for several weeks. LVEF=25-30%. Cardiac cath with no evidence of CAD. He has done well since discharge home. He called in today c/o arm pain at the cath site. He describes pain in his right arm at the cath site. No swelling. No trouble with his hand.   Primary Care Physician: Default, Provider, MD   Past Medical History  Diagnosis Date  . Hypertension   . MS (multiple sclerosis) (HCC)   . Non-ischemic cardiomyopathy [facility])     Past Surgical History  Procedure Laterality Date  . Gallbladder surgery      2009  . Cardiac catheterization N/A 06/01/2015    Procedure: Right/Left Heart Cath and Coronary Angiography;  Surgeon: Marykay Lex, MD;  Location: [facility] INVASIVE CV LAB;  Service: Cardiovascular;  Laterality: N/A;    Current Outpatient Prescriptions  Medication Sig Dispense Refill  . aspirin EC 81 MG EC tablet Take 1 tablet (81 mg total) by mouth daily. 30 tablet 0  . carvedilol (COREG) 12.5 MG tablet Take 1 tablet (12.5 mg total) by mouth 2 (two) times daily with a meal. 60 tablet 0  . furosemide (LASIX) 40 MG tablet Take 1 tablet (40 mg total) by mouth daily. 30 tablet 0  . hydrALAZINE (APRESOLINE) 50 MG tablet Take 1 tablet (50 mg total) by mouth 3 (three) times daily. 90 tablet 6  . lisinopril (PRINIVIL,ZESTRIL) 5 MG tablet Take 1 tablet (5 mg total) by mouth daily. 30 tablet 0  . oxyCODONE-acetaminophen (ROXICET) 5-325 MG tablet Take 1-2 tablets by mouth every 6 (six) hours as needed for severe pain. 20 tablet 0   No current facility-administered medications for this visit.    No Known Allergies  Social History   Social History  . Marital Status: Single    Spouse Name:  N/A  . Number of Children: 2  . Years of Education: 12   Occupational History  . not working    Social History Main Topics  . Smoking status: Current Every Day Smoker -- 0.50 packs/day for 20 years    Types: Cigarettes  . Smokeless tobacco: Never Used  . Alcohol Use: 0.0 oz/week    0 Standard drinks or equivalent per week     Comment: Social  . Drug Use: No  . Sexual Activity: Yes    Birth Control/ Protection: Condom   Other Topics Concern  . Not on file   Social History Narrative   Patient lives at home and he is single. Patient does not work. Patient has 2 children. Right handed.    Family History  Problem Relation Age of Onset  . High blood pressure Mother   . High blood pressure Father     Review of Systems:  As stated in the HPI and otherwise negative.   BP 142/99 mmHg  Pulse 65  Ht 5\' 10"  (1.778 m)  Wt 254 lb (115.214 kg)  BMI 36.45 kg/m2  SpO2 98%  Physical Examination: General: Well developed, well nourished, NAD HEENT: OP clear, mucus membranes moist SKIN: warm, dry. No rashes. Neuro: No focal deficits Musculoskeletal: Muscle strength 5/5 all ex Psychiatric: Mood and affect normal Neck:  No JVD, no carotid bruits, no thyromegaly, no lymphadenopathy. Lungs:Clear bilaterally, no wheezes, rhonci, crackles Cardiovascular: Regular rate and rhythm. No murmurs, gallops or rubs. Abdomen:Soft. Bowel sounds present. Non-tender.  Extremities: No lower extremity edema. Pulses are 2 + in the bilateral DP/PT. Right arm without hematoma. Good radial pulse.   Echo 05/31/15: Left ventricle: The cavity size was moderately dilated. Wall  thickness was increased in a pattern of moderate LVH. Systolic  function was severely reduced. The estimated ejection fraction  was in the range of 25% to 30%. Diffuse hypokinesis. Doppler  parameters are consistent with both elevated ventricular  end-diastolic filling pressure and elevated left atrial filling  pressure. -  Mitral valve: There was mild regurgitation. - Left atrium: The atrium was mildly dilated.  EKG:  EKG is not ordered today. The ekg ordered today demonstrates   Recent Labs: 05/29/2015: B Natriuretic Peptide 1013.4* 05/30/2015: ALT 46; Magnesium 2.0; TSH 3.923 06/01/2015: Hemoglobin 14.3; Platelets 233 06/02/2015: BUN 20; Creatinine, Ser 1.58*; Potassium 4.3; Sodium 139   Lipid Panel No results found for: CHOL, TRIG, HDL, CHOLHDL, VLDL, LDLCALC, LDLDIRECT   Wt Readings from Last 3 Encounters:  06/07/15 254 lb (115.214 kg)  06/02/15 246 lb 6.4 oz (111.766 kg)  08/04/12 255 lb (115.667 kg)     Other studies Reviewed: Additional studies/ records that were reviewed today include: . Review of the above records demonstrates:    Assessment and Plan:   1. Non-ischemic cardiomyopathy: Presumed to be secondary to uncontrolled HTN. Will continue current therapy.   2. HTN: BP elevated today. Will continue Coreg and Lisinopril and will increase Hydralazine to 50 mg po TID.   3. Chronic systolic CHF: Will continue Lasix 40 mg daily. He will use extra Lasix as needed for swelling or weight gain. He will follow weight daily at home  4. Right arm pain: No evidence of hematoma. Good radial pulse on the right. I suspect this will get better over the next few days. Will give Percocet 5/325 mg to use 1-2 pills Q6 hours for next few days as needed for pain.   Current medicines are reviewed at length with the patient today.  The patient does not have concerns regarding medicines.  The following changes have been made:  Hydralazine increased.   Labs/ tests ordered today include:  No orders of the defined types were placed in this encounter.    Disposition:   FU with Tereso Newcomer, PA-C in 4 weeks  Signed, Verne Carrow, MD 06/07/2015 5:19 PM    [facility] [address] [place], [place], [place]  [address] Phone: [phone]; Fax: [phone]

## 2015-06-07 NOTE — Telephone Encounter (Signed)
New message  Pt called req a call back to discuss any after effects from the CATH. Please call back to discuss

## 2015-06-07 NOTE — Telephone Encounter (Signed)
Reviewed with Dr. Clifton James and he can see [name] this afternoon to evaluate site/pain.  Spoke with [name] and he will be here for appt today at 4:45.

## 2015-06-07 NOTE — Telephone Encounter (Signed)
I can see him today. cdm 

## 2015-06-19 ENCOUNTER — Inpatient Hospital Stay: Payer: Medicaid Other | Admitting: Internal Medicine

## 2015-06-26 ENCOUNTER — Ambulatory Visit (INDEPENDENT_AMBULATORY_CARE_PROVIDER_SITE_OTHER): Payer: Medicaid Other | Admitting: Physician Assistant

## 2015-06-26 ENCOUNTER — Encounter: Payer: Self-pay | Admitting: Physician Assistant

## 2015-06-26 VITALS — BP 150/80 | HR 74 | Ht 72.0 in | Wt 252.0 lb

## 2015-06-26 DIAGNOSIS — N183 Chronic kidney disease, stage 3 unspecified: Secondary | ICD-10-CM

## 2015-06-26 DIAGNOSIS — I428 Other cardiomyopathies: Secondary | ICD-10-CM

## 2015-06-26 DIAGNOSIS — I110 Hypertensive heart disease with heart failure: Secondary | ICD-10-CM | POA: Diagnosis not present

## 2015-06-26 DIAGNOSIS — I5022 Chronic systolic (congestive) heart failure: Secondary | ICD-10-CM | POA: Diagnosis not present

## 2015-06-26 DIAGNOSIS — I429 Cardiomyopathy, unspecified: Secondary | ICD-10-CM | POA: Diagnosis not present

## 2015-06-26 LAB — BASIC METABOLIC PANEL
BUN: 12 mg/dL (ref 7–25)
CALCIUM: 9.2 mg/dL (ref 8.6–10.3)
CHLORIDE: 103 mmol/L (ref 98–110)
CO2: 27 mmol/L (ref 20–31)
Creat: 1.3 mg/dL (ref 0.60–1.35)
GLUCOSE: 76 mg/dL (ref 65–99)
POTASSIUM: 4.2 mmol/L (ref 3.5–5.3)
SODIUM: 138 mmol/L (ref 135–146)

## 2015-06-26 MED ORDER — HYDRALAZINE HCL 50 MG PO TABS: 50.0000 mg | ORAL_TABLET | Freq: Three times a day (TID) | ORAL | Status: DC

## 2015-06-26 MED ORDER — CARVEDILOL 12.5 MG PO TABS: 18.7500 mg | ORAL_TABLET | Freq: Two times a day (BID) | ORAL | Status: DC

## 2015-06-26 NOTE — Progress Notes (Signed)
Cardiology Office Note:    Date:  06/26/2015   ID:  [name], DOB 1971-02-22, MRN [id number]  PCP:  Default, Provider, MD  Cardiologist:  Dr. Verne Carrow   Electrophysiologist:  n/a  Referring MD: No ref. provider found   Chief Complaint  Patient presents with  . Hospitalization Follow-up    admx with acute CHF    History of Present Illness:     [name] is a 45 y.o. male with a hx of HTN, Multiple sclerosis, CKD who was admitted to [facility] in 3/17 with acute CHF and hypertensive urgency.  Troponin levels were minimally elevated.  EF was 25-30%. LHC demonstrated no CAD.  Medications were adjusted for his NICM.  He returned to see Dr. Verne Carrow 06/07/15 due to R arm pain post cath.  His R arm looked ok and he was reassured that this would improve soon.    He returns for FU.  Overall, he is doing well. Dyspnea is stable. He denies chest discomfort. Weight is unchanged. Denies orthopnea, PND or edema. Denies syncope.  Wrist pain is better.  He requests refill on Percocet.  I have asked him to take Tylenol or Advil as needed.  Cath site is well healed.     Past Medical History  Diagnosis Date  . Hypertension   . MS (multiple sclerosis) (HCC)   . Non-ischemic cardiomyopathy (HCC)     a. LHC 3/17 - normal cor arteries  . Chronic systolic CHF (congestive heart failure) (HCC)     a. echo 3/17 - Moderate LVH, EF 25-30%, diffuse HK, mild MR, mild LAE    Past Surgical History  Procedure Laterality Date  . Gallbladder surgery      2009  . Cardiac catheterization N/A 06/01/2015    Procedure: Right/Left Heart Cath and Coronary Angiography;  Surgeon: Marykay Lex, MD;  Location: [facility] INVASIVE CV LAB;  Service: Cardiovascular;  Laterality: N/A;    Current Medications: Outpatient Prescriptions Prior to Visit  Medication Sig Dispense Refill  . aspirin EC 81 MG EC tablet Take 1 tablet (81 mg total) by mouth daily. 30 tablet 0  . furosemide (LASIX) 40 MG  tablet Take 1 tablet (40 mg total) by mouth daily. 30 tablet 0  . lisinopril (PRINIVIL,ZESTRIL) 5 MG tablet Take 1 tablet (5 mg total) by mouth daily. 30 tablet 0  . oxyCODONE-acetaminophen (ROXICET) 5-325 MG tablet Take 1-2 tablets by mouth every 6 (six) hours as needed for severe pain. 20 tablet 0  . carvedilol (COREG) 12.5 MG tablet Take 1 tablet (12.5 mg total) by mouth 2 (two) times daily with a meal. 60 tablet 0  . hydrALAZINE (APRESOLINE) 50 MG tablet Take 1 tablet (50 mg total) by mouth 3 (three) times daily. 90 tablet 6   No facility-administered medications prior to visit.     Allergies:   Review of patient's allergies indicates no known allergies.   Social History   Social History  . Marital Status: Single    Spouse Name: N/A  . Number of Children: 2  . Years of Education: 12   Occupational History  . not working    Social History Main Topics  . Smoking status: Current Every Day Smoker -- 0.50 packs/day for 20 years    Types: Cigarettes  . Smokeless tobacco: Never Used  . Alcohol Use: 0.0 oz/week    0 Standard drinks or equivalent per week     Comment: Social  . Drug Use: No  . Sexual  Activity: Yes    Birth Control/ Protection: Condom   Other Topics Concern  . None   Social History Narrative   Patient lives at home and he is single. Patient does not work. Patient has 2 children. Right handed.     Family History:  The patient's family history includes High blood pressure in his father and mother.   ROS:   Please see the history of present illness.    ROS All other systems reviewed and are negative.   Physical Exam:    VS:  BP 150/80 mmHg  Pulse 74  Ht 6' (1.829 m)  Wt 252 lb (114.306 kg)  BMI 34.17 kg/m2   GEN: Well nourished, well developed, in no acute distress HEENT: normal Neck: no JVD, no masses Cardiac: Normal S1/S2, RRR; no murmurs, rubs, or gallops, no edema;  right wrist without hematoma or mass    Respiratory:  clear to auscultation  bilaterally; no wheezing, rhonchi or rales GI: soft, nontender, nondistended MS: no deformity or atrophy Skin: warm and dry Neuro: No focal deficits  Psych: Alert and oriented x 3, normal affect  Wt Readings from Last 3 Encounters:  06/26/15 252 lb (114.306 kg)  06/07/15 254 lb (115.214 kg)  06/02/15 246 lb 6.4 oz (111.766 kg)      Studies/Labs Reviewed:     EKG:  EKG is  ordered today.  The ekg ordered today demonstrates NSR< HR 75, normal axis, LVH, TWI 2, 3, aVF, V5-6 (repol abnormality), QTc 471 ms, no change from prior tracing.   Recent Labs: 05/29/2015: B Natriuretic Peptide 1013.4* 05/30/2015: ALT 46; Magnesium 2.0; TSH 3.923 06/01/2015: Hemoglobin 14.3; Platelets 233 06/02/2015: BUN 20; Creatinine, Ser 1.58*; Potassium 4.3; Sodium 139   Recent Lipid Panel No results found for: CHOL, TRIG, HDL, CHOLHDL, VLDL, LDLCALC, LDLDIRECT  Additional studies/ records that were reviewed today include:   LHC 06/01/15 LM okay LAD normal LCx normal RCA normal EF 25-30% LVEDP 29 mmHg  Echo 05/31/15 Moderate LVH, EF 25-30%, diffuse HK, mild MR, mild LAE  LE venous duplex 05/30/15 No evidence of deep vein thrombosis involving the visualized veins of the right lower extremity and left lower extremity.  ASSESSMENT:     1. Chronic systolic CHF (congestive heart failure) (HCC)   2. NICM (nonischemic cardiomyopathy) (HCC)   3. Hypertensive heart disease with heart failure (HCC)   4. CKD (chronic kidney disease) stage 3, GFR 30-59 ml/min     PLAN:     In order of problems listed above:  1. Chronic systolic HF - Volume stable.  He is NYHA 2.  Continue beta-blocker, ACE inhibitor, Furosemide, Hydralazine.  -  Increase Coreg 18.75 mg bid  -  Consider Imdur at FU if BP remains elevated (he does not take PDE-5 inhibitors)  -  With CKD, I am not certain he would be able to tol Spironolactone.   2. NICM - Continue to adjust Rx.  Plan repeat Echo in next 3 mos to recheck EF.  If EF < 35%,  refer to EP for ICD.    3. HTN - BP still elevated.  Adjust Coreg as noted.   4. CKD - Repeat BMET today.     Medication Adjustments/Labs and Tests Ordered: Current medicines are reviewed at length with the patient today.  Concerns regarding medicines are outlined above.  Medication changes, Labs and Tests ordered today are outlined in the Patient Instructions noted below. Patient Instructions  Medication Instructions:  INCREASE COREG TO 18.75 MG TWICE  DAILY; NEW RX SENT IN (THIS WILL BE 1 AND 1/2 TABS TWICE DAILY) Labwork: TODAY BMET Testing/Procedures: NONE Follow-Up: 07/27/15 @ 2:20 WITH Timothy Townsel, PAC  Any Other Special Instructions Will Be Listed Below (If Applicable). If you need a refill on your cardiac medications before your next appointment, please call your pharmacy.   Signed, Tereso Newcomer, PA-C  06/26/2015 4:42 PM    [facility] [address] [place], [place], [place]  [address] Phone: [phone]; Fax: [phone]

## 2015-06-26 NOTE — Patient Instructions (Addendum)
Medication Instructions:  INCREASE COREG TO 18.75 MG TWICE DAILY; NEW RX SENT IN (THIS WILL BE 1 AND 1/2 TABS TWICE DAILY) Labwork: TODAY BMET Testing/Procedures: NONE Follow-Up: 07/27/15 @ 2:20 WITH SCOTT WEAVER, PAC  Any Other Special Instructions Will Be Listed Below (If Applicable). If you need a refill on your cardiac medications before your next appointment, please call your pharmacy.

## 2015-06-27 MED ORDER — SPIRONOLACTONE 25 MG PO TABS: 12.5000 mg | ORAL_TABLET | Freq: Every day | ORAL | Status: DC

## 2015-06-27 NOTE — Addendum Note (Signed)
Addended byAlben Spittle, Lorin Picket T on: 06/27/2015 05:20 PM   Modules accepted: Orders

## 2015-06-28 ENCOUNTER — Telehealth: Payer: Self-pay | Admitting: *Deleted

## 2015-06-28 NOTE — Telephone Encounter (Signed)
Pt has been notified of lab results and is agreeable to starting Spironolactone 12.5 mg daily; BMET 5/3, 5/10. Rx sent in .

## 2015-07-05 ENCOUNTER — Other Ambulatory Visit: Payer: Medicaid Other

## 2015-07-12 ENCOUNTER — Other Ambulatory Visit: Payer: Medicaid Other

## 2015-07-27 ENCOUNTER — Ambulatory Visit: Payer: Medicaid Other | Admitting: Physician Assistant

## 2015-08-09 NOTE — Progress Notes (Signed)
Cardiology Office Note:    Date:  08/09/2015   ID:  [name], DOB January 03, 1971, MRN [id number]  PCP:  Default, Provider, MD  Cardiologist: Dr. Verne Carrow  Electrophysiologist: n/a  Referring MD: No ref. provider found   Chief Complaint  Patient presents with  . Congestive Heart Failure    follow up    History of Present Illness:     [name] is a 45 y.o. male with a hx of HTN, Multiple sclerosis, CKD who was admitted to [facility] in 3/17 with acute CHF and hypertensive urgency. Troponin levels were minimally elevated. EF was 25-30%. LHC demonstrated no CAD. Medications were adjusted for his NICM. He returned to see Dr. Verne Carrow 06/07/15 due to R arm pain post cath. His R arm looked ok and he was reassured that this would improve soon.   He returns for FU. Overall, he is doing well. Dyspnea is stable. He denies chest discomfort. Weight is unchanged. Denies orthopnea, PND or edema. Denies syncope. Wrist pain is better. He requests refill on Percocet. I have asked him to take Tylenol or Advil as needed. Cath site is well healed.    Past Medical History  Diagnosis Date  . Hypertension   . MS (multiple sclerosis) (HCC)   . Non-ischemic cardiomyopathy (HCC)     a. LHC 3/17 - normal cor arteries  . Chronic systolic CHF (congestive heart failure) (HCC)     a. echo 3/17 - Moderate LVH, EF 25-30%, diffuse HK, mild MR, mild LAE    Past Surgical History  Procedure Laterality Date  . Gallbladder surgery      2009  . Cardiac catheterization N/A 06/01/2015    Procedure: Right/Left Heart Cath and Coronary Angiography;  Surgeon: Marykay Lex, MD;  Location: [facility] INVASIVE CV LAB;  Service: Cardiovascular;  Laterality: N/A;    Current Medications: Outpatient Prescriptions Prior to Visit  Medication Sig Dispense Refill  . aspirin EC 81 MG EC tablet Take 1 tablet (81 mg total) by mouth daily. 30 tablet 0  . carvedilol (COREG) 12.5 MG tablet Take  1.5 tablets (18.75 mg total) by mouth 2 (two) times daily with a meal. 270 tablet 3  . furosemide (LASIX) 40 MG tablet Take 1 tablet (40 mg total) by mouth daily. 30 tablet 0  . hydrALAZINE (APRESOLINE) 50 MG tablet Take 1 tablet (50 mg total) by mouth 3 (three) times daily. 270 tablet 3  . lisinopril (PRINIVIL,ZESTRIL) 5 MG tablet Take 1 tablet (5 mg total) by mouth daily. 30 tablet 0  . oxyCODONE-acetaminophen (ROXICET) 5-325 MG tablet Take 1-2 tablets by mouth every 6 (six) hours as needed for severe pain. 20 tablet 0  . spironolactone (ALDACTONE) 25 MG tablet Take 0.5 tablets (12.5 mg total) by mouth daily. 30 tablet 11   No facility-administered medications prior to visit.      Allergies:   Review of patient's allergies indicates no known allergies.   Social History   Social History  . Marital Status: Single    Spouse Name: N/A  . Number of Children: 2  . Years of Education: 12   Occupational History  . not working    Social History Main Topics  . Smoking status: Current Every Day Smoker -- 0.50 packs/day for 20 years    Types: Cigarettes  . Smokeless tobacco: Never Used  . Alcohol Use: 0.0 oz/week    0 Standard drinks or equivalent per week     Comment: Social  . Drug Use:  No  . Sexual Activity: Yes    Birth Control/ Protection: Condom   Other Topics Concern  . Not on file   Social History Narrative   Patient lives at home and he is single. Patient does not work. Patient has 2 children. Right handed.     Family History:  The patient's family history includes High blood pressure in his father and mother.   ROS:   Please see the history of present illness.    ROS All other systems reviewed and are negative.   Physical Exam:    VS:  There were no vitals taken for this visit.   Physical Exam  Wt Readings from Last 3 Encounters:  06/26/15 252 lb (114.306 kg)  06/07/15 254 lb (115.214 kg)  06/02/15 246 lb 6.4 oz (111.766 kg)      Studies/Labs Reviewed:      EKG:  EKG is  ordered today.  The ekg ordered today demonstrates   Recent Labs: 05/29/2015: B Natriuretic Peptide 1013.4* 05/30/2015: ALT 46; Magnesium 2.0; TSH 3.923 06/01/2015: Hemoglobin 14.3; Platelets 233 06/26/2015: BUN 12; Creat 1.30; Potassium 4.2; Sodium 138   Recent Lipid Panel No results found for: CHOL, TRIG, HDL, CHOLHDL, VLDL, LDLCALC, LDLDIRECT  Additional studies/ records that were reviewed today include:   LHC 06/01/15 LM okay LAD normal LCx normal RCA normal EF 25-30% LVEDP 29 mmHg  Echo 05/31/15 Moderate LVH, EF 25-30%, diffuse HK, mild MR, mild LAE  LE venous duplex 05/30/15 No evidence of deep vein thrombosis involving the visualized veins of the right lower extremity and left lower extremity.  ASSESSMENT:     1. Chronic systolic CHF (congestive heart failure) (HCC)   2. NICM (nonischemic cardiomyopathy) (HCC)   3. Hypertensive heart disease with heart failure (HCC)   4. CKD (chronic kidney disease) stage 3, GFR 30-59 ml/min     PLAN:     In order of problems listed above:  1. Chronic systolic HF - Volume stable. He is NYHA 2. Continue beta-blocker, ACE inhibitor, Furosemide, Hydralazine. - Increase Coreg 18.75 mg bid - Consider Imdur at FU if BP remains elevated (he does not take PDE-5 inhibitors) - With CKD, I am not certain he would be able to tol Spironolactone.   2. NICM - Continue to adjust Rx. Plan repeat Echo in next 3 mos to recheck EF. If EF < 35%, refer to EP for ICD.   3. HTN - BP still elevated. Adjust Coreg as noted.   4. CKD - Repeat BMET today.    Medication Adjustments/Labs and Tests Ordered: Current medicines are reviewed at length with the patient today.  Concerns regarding medicines are outlined above.  Medication changes, Labs and Tests ordered today are outlined in the Patient Instructions noted below. There are no Patient Instructions on file for this visit. Signed, Tereso Newcomer, PA-C  08/09/2015 10:39 PM    [facility] [address] [place], [place], [place]  [address] Phone: [phone]; Fax: [phone]     This encounter was created in error - please disregard.

## 2015-08-10 ENCOUNTER — Encounter: Payer: Medicaid Other | Admitting: Physician Assistant

## 2015-08-10 ENCOUNTER — Encounter: Payer: Self-pay | Admitting: Physician Assistant

## 2015-08-10 VITALS — Ht 73.0 in

## 2015-08-18 ENCOUNTER — Telehealth: Payer: Self-pay | Admitting: Cardiovascular Disease

## 2015-08-18 NOTE — Telephone Encounter (Signed)
Pt's wife calling re swelling in both ankles and legs, some SOB since yesterday pls call wants appt today [phone]

## 2015-08-18 NOTE — Telephone Encounter (Signed)
Returned call to patient's wife.She stated her husband has been sob all day,increase swelling in both lower legs and feet.Unable to weigh no scale.Stated she would like him seen today.Advised no appointments available this afternoon.Stated patient presently taking a nap.Advised increase Lasix 40 mg twice a day over the weekend,call office on Mon 08/21/15 to report his condition.Appointment scheduled with Nada Boozer NP 08/25/15 at 1:30 pm.Advised to go to ER if needed.

## 2015-08-21 NOTE — Telephone Encounter (Signed)
Agree. thanks

## 2015-08-25 ENCOUNTER — Encounter: Payer: Medicaid Other | Admitting: Physician Assistant

## 2015-08-25 NOTE — Progress Notes (Signed)
This encounter was created in error - please disregard. This encounter was created in error - please disregard. This encounter was created in error - please disregard. 

## 2015-08-25 NOTE — Progress Notes (Deleted)
Error - No show ROS

## 2015-11-20 ENCOUNTER — Ambulatory Visit: Payer: Medicaid Other | Admitting: Cardiovascular Disease

## 2015-11-20 NOTE — Progress Notes (Deleted)
No chief complaint on file.    History of Present Illness: 45 yo male with history of non-ischemic cardiomyopathy, chronic systolic CHF, HTN admitted to [facility] March 2017 with hypertensive urgency, acute CHF. He was diuresed and BP meds restarted. He had been out of meds for several weeks. LVEF=25-30%. Cardiac cath with no evidence of CAD.   He is here today for follow up.   Primary Care Physician: Default, Provider, MD   Past Medical History:  Diagnosis Date  . Chronic systolic CHF (congestive heart failure) (HCC)    a. echo 3/17 - Moderate LVH, EF 25-30%, diffuse HK, mild MR, mild LAE  . Hypertension   . MS (multiple sclerosis) (HCC)   . Non-ischemic cardiomyopathy (HCC)    a. LHC 3/17 - normal cor arteries    Past Surgical History:  Procedure Laterality Date  . CARDIAC CATHETERIZATION N/A 06/01/2015   Procedure: Right/Left Heart Cath and Coronary Angiography;  Surgeon: Parker Lex, MD;  Location: [facility] INVASIVE CV LAB;  Service: Cardiovascular;  Laterality: N/A;  . GALLBLADDER SURGERY     2009    Current Outpatient Prescriptions  Medication Sig Dispense Refill  . aspirin EC 81 MG EC tablet Take 1 tablet (81 mg total) by mouth daily. 30 tablet 0  . carvedilol (COREG) 12.5 MG tablet Take 1.5 tablets (18.75 mg total) by mouth 2 (two) times daily with a meal. 270 tablet 3  . furosemide (LASIX) 40 MG tablet Take 1 tablet (40 mg total) by mouth daily. 30 tablet 0  . hydrALAZINE (APRESOLINE) 50 MG tablet Take 1 tablet (50 mg total) by mouth 3 (three) times daily. 270 tablet 3  . lisinopril (PRINIVIL,ZESTRIL) 5 MG tablet Take 1 tablet (5 mg total) by mouth daily. 30 tablet 0  . oxyCODONE-acetaminophen (ROXICET) 5-325 MG tablet Take 1-2 tablets by mouth every 6 (six) hours as needed for severe pain. 20 tablet 0  . spironolactone (ALDACTONE) 25 MG tablet Take 0.5 tablets (12.5 mg total) by mouth daily. 30 tablet 11   No current facility-administered medications for this visit.      No Known Allergies  Social History   Social History  . Marital status: Single    Spouse name: N/A  . Number of children: 2  . Years of education: 12   Occupational History  . not working    Social History Main Topics  . Smoking status: Current Every Day Smoker    Packs/day: 0.50    Years: 20.00    Types: Cigarettes  . Smokeless tobacco: Never Used  . Alcohol use 0.0 oz/week     Comment: Social  . Drug use: No  . Sexual activity: Yes    Birth control/ protection: Condom   Other Topics Concern  . Not on file   Social History Narrative   Patient lives at home and he is single. Patient does not work. Patient has 2 children. Right handed.    Family History  Problem Relation Age of Onset  . High blood pressure Mother   . High blood pressure Father     Review of Systems:  As stated in the HPI and otherwise negative.   There were no vitals taken for this visit.  Physical Examination: General: Well developed, well nourished, NAD  HEENT: OP clear, mucus membranes moist  SKIN: warm, dry. No rashes. Neuro: No focal deficits  Musculoskeletal: Muscle strength 5/5 all ex Psychiatric: Mood and affect normal  Neck: No JVD, no carotid bruits, no thyromegaly,  no lymphadenopathy.  Lungs:Clear bilaterally, no wheezes, rhonci, crackles Cardiovascular: Regular rate and rhythm. No murmurs, gallops or rubs. Abdomen:Soft. Bowel sounds present. Non-tender.  Extremities: No lower extremity edema. Pulses are 2 + in the bilateral DP/PT. Right arm without hematoma. Good radial pulse.   Echo 05/31/15: Left ventricle: The cavity size was moderately dilated. Wall  thickness was increased in a pattern of moderate LVH. Systolic  function was severely reduced. The estimated ejection fraction  was in the range of 25% to 30%. Diffuse hypokinesis. Doppler  parameters are consistent with both elevated ventricular  end-diastolic filling pressure and elevated left atrial filling   pressure. - Mitral valve: There was mild regurgitation. - Left atrium: The atrium was mildly dilated.  EKG:  EKG is not ordered today. The ekg ordered today demonstrates   Recent Labs: 05/29/2015: B Natriuretic Peptide 1,013.4 05/30/2015: ALT 46; Magnesium 2.0; TSH 3.923 06/01/2015: Hemoglobin 14.3; Platelets 233 06/26/2015: BUN 12; Creat 1.30; Potassium 4.2; Sodium 138   Lipid Panel No results found for: CHOL, TRIG, HDL, CHOLHDL, VLDL, LDLCALC, LDLDIRECT   Wt Readings from Last 3 Encounters:  06/26/15 252 lb (114.3 kg)  06/07/15 254 lb (115.2 kg)  06/02/15 246 lb 6.4 oz (111.8 kg)     Other studies Reviewed: Additional studies/ records that were reviewed today include: . Review of the above records demonstrates:    Assessment and Plan:   1. Non-ischemic cardiomyopathy: Presumed to be secondary to uncontrolled HTN. Will continue current therapy. Will repeat echo now. ***  2. HTN: BP *** controlled. Will continue Coreg, Lisinopril and Hydralazine.    3. Chronic systolic CHF: Will continue Lasix 40 mg daily. He will use extra Lasix as needed for swelling or weight gain. He will follow weight daily at home  Current medicines are reviewed at length with the patient today.  The patient does not have concerns regarding medicines.  The following changes have been made:  Hydralazine increased.   Labs/ tests ordered today include:  No orders of the defined types were placed in this encounter.   Disposition:   FU with Parker Newcomer, PA-C in 4 weeks  Signed, Parker Carrow, MD 11/20/2015 6:15 AM    [facility] [address] [place], [place], [place]  [address] Phone: [phone]; Fax: [phone]

## 2015-11-23 ENCOUNTER — Encounter: Payer: Self-pay | Admitting: Cardiovascular Disease

## 2015-11-23 ENCOUNTER — Encounter (HOSPITAL_COMMUNITY): Payer: Self-pay | Admitting: Emergency Medicine

## 2015-11-23 ENCOUNTER — Emergency Department (HOSPITAL_COMMUNITY): Payer: Medicaid Other

## 2015-11-23 ENCOUNTER — Inpatient Hospital Stay (HOSPITAL_COMMUNITY)
Admission: EM | Admit: 2015-11-23 | Discharge: 2015-11-26 | DRG: 291 | Disposition: A | Discharge status: Home / Self Care | Payer: Medicaid Other | Attending: Cardiovascular Disease | Admitting: Cardiovascular Disease

## 2015-11-23 DIAGNOSIS — I509 Heart failure, unspecified: Secondary | ICD-10-CM

## 2015-11-23 DIAGNOSIS — I5023 Acute on chronic systolic (congestive) heart failure: Secondary | ICD-10-CM | POA: Diagnosis present

## 2015-11-23 DIAGNOSIS — Z9114 Patient's other noncompliance with medication regimen: Secondary | ICD-10-CM | POA: Diagnosis not present

## 2015-11-23 DIAGNOSIS — I214 Non-ST elevation (NSTEMI) myocardial infarction: Secondary | ICD-10-CM

## 2015-11-23 DIAGNOSIS — Z7982 Long term (current) use of aspirin: Secondary | ICD-10-CM | POA: Diagnosis not present

## 2015-11-23 DIAGNOSIS — F17210 Nicotine dependence, cigarettes, uncomplicated: Secondary | ICD-10-CM | POA: Diagnosis present

## 2015-11-23 DIAGNOSIS — Z79891 Long term (current) use of opiate analgesic: Secondary | ICD-10-CM

## 2015-11-23 DIAGNOSIS — I130 Hypertensive heart and chronic kidney disease with heart failure and stage 1 through stage 4 chronic kidney disease, or unspecified chronic kidney disease: Principal | ICD-10-CM | POA: Diagnosis present

## 2015-11-23 DIAGNOSIS — I420 Dilated cardiomyopathy: Secondary | ICD-10-CM | POA: Diagnosis present

## 2015-11-23 DIAGNOSIS — N183 Chronic kidney disease, stage 3 (moderate): Secondary | ICD-10-CM | POA: Diagnosis present

## 2015-11-23 DIAGNOSIS — Z6834 Body mass index (BMI) 34.0-34.9, adult: Secondary | ICD-10-CM | POA: Diagnosis not present

## 2015-11-23 DIAGNOSIS — I428 Other cardiomyopathies: Secondary | ICD-10-CM | POA: Diagnosis present

## 2015-11-23 DIAGNOSIS — E669 Obesity, unspecified: Secondary | ICD-10-CM | POA: Diagnosis present

## 2015-11-23 DIAGNOSIS — R0602 Shortness of breath: Secondary | ICD-10-CM | POA: Diagnosis present

## 2015-11-23 DIAGNOSIS — Z79899 Other long term (current) drug therapy: Secondary | ICD-10-CM | POA: Diagnosis not present

## 2015-11-23 DIAGNOSIS — I248 Other forms of acute ischemic heart disease: Secondary | ICD-10-CM | POA: Diagnosis present

## 2015-11-23 DIAGNOSIS — G35 Multiple sclerosis: Secondary | ICD-10-CM | POA: Diagnosis present

## 2015-11-23 DIAGNOSIS — I5022 Chronic systolic (congestive) heart failure: Secondary | ICD-10-CM

## 2015-11-23 LAB — CBC WITH DIFFERENTIAL/PLATELET
BASOS ABS: 0 10*3/uL (ref 0.0–0.1)
BASOS PCT: 0 %
EOS ABS: 0.2 10*3/uL (ref 0.0–0.7)
EOS PCT: 2 %
HCT: 38.2 % — ABNORMAL LOW (ref 39.0–52.0)
Hemoglobin: 12 g/dL — ABNORMAL LOW (ref 13.0–17.0)
Lymphocytes Relative: 11 %
Lymphs Abs: 1.1 10*3/uL (ref 0.7–4.0)
MCH: 25.8 pg — ABNORMAL LOW (ref 26.0–34.0)
MCHC: 31.4 g/dL (ref 30.0–36.0)
MCV: 82 fL (ref 78.0–100.0)
MONO ABS: 0.8 10*3/uL (ref 0.1–1.0)
Monocytes Relative: 8 %
Neutro Abs: 8.5 10*3/uL — ABNORMAL HIGH (ref 1.7–7.7)
Neutrophils Relative %: 79 %
PLATELETS: 198 10*3/uL (ref 150–400)
RBC: 4.66 MIL/uL (ref 4.22–5.81)
RDW: 15 % (ref 11.5–15.5)
WBC: 10.6 10*3/uL — AB (ref 4.0–10.5)

## 2015-11-23 LAB — I-STAT TROPONIN, ED: TROPONIN I, POC: 0.15 ng/mL — AB (ref 0.00–0.08)

## 2015-11-23 LAB — CREATININE, SERUM
CREATININE: 1.51 mg/dL — AB (ref 0.61–1.24)
GFR calc Af Amer: >60 mL/min (ref >60)
GFR calc non Af Amer: 55 mL/min — ABNORMAL LOW (ref >60)

## 2015-11-23 LAB — GLUCOSE, CAPILLARY: Glucose-Capillary: 119 mg/dL — ABNORMAL HIGH (ref 65–99)

## 2015-11-23 LAB — BASIC METABOLIC PANEL
ANION GAP: 7 (ref 5–15)
BUN: 18 mg/dL (ref 6–20)
CHLORIDE: 106 mmol/L (ref 101–111)
CO2: 28 mmol/L (ref 22–32)
Calcium: 9.1 mg/dL (ref 8.9–10.3)
Creatinine, Ser: 1.58 mg/dL — ABNORMAL HIGH (ref 0.61–1.24)
GFR calc Af Amer: >60 mL/min (ref >60)
GFR, EST NON AFRICAN AMERICAN: 52 mL/min — AB (ref >60)
Glucose, Bld: 99 mg/dL (ref 65–99)
POTASSIUM: 3.7 mmol/L (ref 3.5–5.1)
SODIUM: 141 mmol/L (ref 135–145)

## 2015-11-23 LAB — BRAIN NATRIURETIC PEPTIDE: B NATRIURETIC PEPTIDE 5: 1950.6 pg/mL — AB (ref 0.0–100.0)

## 2015-11-23 LAB — CBC
HCT: 39 % (ref 39.0–52.0)
Hemoglobin: 12.1 g/dL — ABNORMAL LOW (ref 13.0–17.0)
MCH: 25.5 pg — AB (ref 26.0–34.0)
MCHC: 31 g/dL (ref 30.0–36.0)
MCV: 82.1 fL (ref 78.0–100.0)
PLATELETS: 200 10*3/uL (ref 150–400)
RBC: 4.75 MIL/uL (ref 4.22–5.81)
RDW: 15.1 % (ref 11.5–15.5)
WBC: 11.6 10*3/uL — ABNORMAL HIGH (ref 4.0–10.5)

## 2015-11-23 LAB — TROPONIN I
Troponin I: 0.19 ng/mL (ref <0.03)
Troponin I: 0.16 ng/mL (ref <0.03)

## 2015-11-23 LAB — MAGNESIUM: MAGNESIUM: 1.9 mg/dL (ref 1.7–2.4)

## 2015-11-23 MED ORDER — SODIUM CHLORIDE 0.9 % IV SOLN: 250.0000 mL | INTRAVENOUS | Status: DC | PRN

## 2015-11-23 MED ORDER — FUROSEMIDE 10 MG/ML IJ SOLN
40.0000 mg | Freq: Two times a day (BID) | INTRAMUSCULAR | Status: DC
  Administered 2015-11-23 – 2015-11-24 (×3): 40 mg via INTRAVENOUS
  Filled 2015-11-23 (×3): qty 4

## 2015-11-23 MED ORDER — SPIRONOLACTONE 25 MG PO TABS
12.5000 mg | ORAL_TABLET | Freq: Two times a day (BID) | ORAL | Status: DC
  Administered 2015-11-23 – 2015-11-26 (×7): 12.5 mg via ORAL
  Filled 2015-11-23 (×7): qty 1

## 2015-11-23 MED ORDER — ACETAMINOPHEN 325 MG PO TABS: 650.0000 mg | ORAL_TABLET | ORAL | Status: DC | PRN

## 2015-11-23 MED ORDER — ASPIRIN 325 MG PO TABS: 325.0000 mg | ORAL_TABLET | Freq: Once | ORAL | Status: DC

## 2015-11-23 MED ORDER — LOSARTAN POTASSIUM 50 MG PO TABS
100.0000 mg | ORAL_TABLET | Freq: Every day | ORAL | Status: DC
  Administered 2015-11-23 – 2015-11-25 (×3): 100 mg via ORAL
  Filled 2015-11-23 (×3): qty 2

## 2015-11-23 MED ORDER — SODIUM CHLORIDE 0.9% FLUSH: 3.0000 mL | INTRAVENOUS | Status: DC | PRN

## 2015-11-23 MED ORDER — CARVEDILOL 6.25 MG PO TABS
18.7500 mg | ORAL_TABLET | Freq: Two times a day (BID) | ORAL | Status: DC
  Filled 2015-11-23 (×2): qty 1

## 2015-11-23 MED ORDER — AMLODIPINE BESYLATE 5 MG PO TABS
5.0000 mg | ORAL_TABLET | Freq: Every day | ORAL | Status: DC
  Administered 2015-11-23: 5 mg via ORAL
  Filled 2015-11-23: qty 1

## 2015-11-23 MED ORDER — HEPARIN SODIUM (PORCINE) 5000 UNIT/ML IJ SOLN
5000.0000 [IU] | Freq: Three times a day (TID) | INTRAMUSCULAR | Status: DC
  Administered 2015-11-23 – 2015-11-25 (×8): 5000 [IU] via SUBCUTANEOUS
  Filled 2015-11-23 (×8): qty 1

## 2015-11-23 MED ORDER — AMLODIPINE BESYLATE 5 MG PO TABS
5.0000 mg | ORAL_TABLET | Freq: Two times a day (BID) | ORAL | Status: DC
  Administered 2015-11-23 (×2): 5 mg via ORAL
  Filled 2015-11-23 (×2): qty 1

## 2015-11-23 MED ORDER — HYDRALAZINE HCL 50 MG PO TABS
50.0000 mg | ORAL_TABLET | Freq: Three times a day (TID) | ORAL | Status: DC
  Administered 2015-11-23 (×3): 50 mg via ORAL
  Filled 2015-11-23 (×3): qty 1

## 2015-11-23 MED ORDER — LISINOPRIL 5 MG PO TABS: 5.0000 mg | ORAL_TABLET | Freq: Every day | ORAL | Status: DC

## 2015-11-23 MED ORDER — SODIUM CHLORIDE 0.9% FLUSH
3.0000 mL | Freq: Two times a day (BID) | INTRAVENOUS | Status: DC
  Administered 2015-11-23 – 2015-11-26 (×7): 3 mL via INTRAVENOUS

## 2015-11-23 MED ORDER — SPIRONOLACTONE 25 MG PO TABS: 12.5000 mg | ORAL_TABLET | Freq: Every day | ORAL | Status: DC

## 2015-11-23 MED ORDER — ONDANSETRON HCL 4 MG/2ML IJ SOLN: 4.0000 mg | Freq: Four times a day (QID) | INTRAMUSCULAR | Status: DC | PRN

## 2015-11-23 MED ORDER — METOPROLOL TARTRATE 12.5 MG HALF TABLET
12.5000 mg | ORAL_TABLET | Freq: Two times a day (BID) | ORAL | Status: DC
Start: 2015-11-23 — End: 2015-11-26
  Administered 2015-11-23 – 2015-11-26 (×7): 12.5 mg via ORAL
  Filled 2015-11-23 (×7): qty 1

## 2015-11-23 MED ORDER — CLONIDINE HCL 0.1 MG PO TABS
0.1000 mg | ORAL_TABLET | Freq: Two times a day (BID) | ORAL | Status: DC
  Administered 2015-11-23 (×2): 0.1 mg via ORAL
  Filled 2015-11-23 (×3): qty 1

## 2015-11-23 NOTE — ED Provider Notes (Signed)
[facility]-EMERGENCY DEPT Provider Note   CSN: [id number] Arrival date & time: 11/23/15  0014  By signing my name below, I, Rosario Adie, attest that this documentation has been prepared under the direction and in the presence of Tomasita Crumble, MD. Electronically Signed: Rosario Adie, ED Scribe. 11/23/15. 12:58 AM.  History   Chief Complaint Chief Complaint  Patient presents with  . Shortness of Breath   The history is provided by the patient. No language interpreter was used.   HPI Comments: [name] is a 45 y.o. male BIB EMS, with a PMHx of CHF (on 80mg  Lasix), CKD, and MS, who presents to the Emergency Department complaining of sudden onset, pressure-like, left-sided chest pain onset PTA. Pt reports associated SOB over the last several weeks, nausea, emesis x 1 episode today, and diaphoresis secondary to his SOB. Pt notes that he has intermittent leg swelling at baseline, but denies any acute changes to this problem. He notes that his SOB is sometimes exacerbated with laying supine. Pt was given 324mg  ASA en route via EMS, and denies any CP upon arrival; however, he is still experiencing shortness of breath. No other associated symptoms or complaints.   Cardiologist: Algie Coffer, MD  Past Medical History:  Diagnosis Date  . Chronic systolic CHF (congestive heart failure) (HCC)    a. echo 3/17 - Moderate LVH, EF 25-30%, diffuse HK, mild MR, mild LAE  . Hypertension   . MS (multiple sclerosis) (HCC)   . Non-ischemic cardiomyopathy (HCC)    a. LHC 3/17 - normal cor arteries    Patient Active Problem List   Diagnosis Date Noted  . Noncompliance with medication treatment due to intermittent use of medication   . NICM (nonischemic cardiomyopathy) (HCC)   . CKD (chronic kidney disease) stage 3, GFR 30-59 ml/min 05/30/2015  . Chronic systolic CHF (congestive heart failure) (HCC) 05/30/2015  . Hypertensive heart disease   . MS (multiple sclerosis) (HCC)     Past Surgical  History:  Procedure Laterality Date  . CARDIAC CATHETERIZATION N/A 06/01/2015   Procedure: Right/Left Heart Cath and Coronary Angiography;  Surgeon: Marykay Lex, MD;  Location: [facility] INVASIVE CV LAB;  Service: Cardiovascular;  Laterality: N/A;  . GALLBLADDER SURGERY     2009    Home Medications    Prior to Admission medications   Medication Sig Start Date End Date Taking? Authorizing Provider  aspirin EC 81 MG EC tablet Take 1 tablet (81 mg total) by mouth daily. 06/02/15   Nishant Dhungel, MD  carvedilol (COREG) 12.5 MG tablet Take 1.5 tablets (18.75 mg total) by mouth 2 (two) times daily with a meal. 06/26/15   Beatrice Lecher, PA-C  furosemide (LASIX) 40 MG tablet Take 1 tablet (40 mg total) by mouth daily. 06/02/15   Nishant Dhungel, MD  hydrALAZINE (APRESOLINE) 50 MG tablet Take 1 tablet (50 mg total) by mouth 3 (three) times daily. 06/26/15   Beatrice Lecher, PA-C  lisinopril (PRINIVIL,ZESTRIL) 5 MG tablet Take 1 tablet (5 mg total) by mouth daily. 06/02/15   Nishant Dhungel, MD  oxyCODONE-acetaminophen (ROXICET) 5-325 MG tablet Take 1-2 tablets by mouth every 6 (six) hours as needed for severe pain. 06/07/15   Kathleene Hazel, MD  spironolactone (ALDACTONE) 25 MG tablet Take 0.5 tablets (12.5 mg total) by mouth daily. 06/27/15   Beatrice Lecher, PA-C   Family History Family History  Problem Relation Age of Onset  . High blood pressure Mother   . High blood pressure  Father    Social History Social History  Substance Use Topics  . Smoking status: Current Every Day Smoker    Packs/day: 0.50    Years: 20.00    Types: Cigarettes  . Smokeless tobacco: Never Used  . Alcohol use 0.0 oz/week     Comment: Social   Allergies   Review of patient's allergies indicates no known allergies.  Review of Systems Review of Systems A complete 10 system review of systems was obtained and all systems are negative except as noted in the HPI and PMH.   Physical Exam Updated Vital Signs BP (!)  194/138   Pulse 95   Temp 98.3 F (36.8 C) (Oral)   Resp 19   Ht 6' (1.829 m)   Wt 255 lb (115.7 kg)   SpO2 94%   BMI 34.58 kg/m   Physical Exam  Constitutional: He is oriented to person, place, and time. Vital signs are normal. He appears well-developed and well-nourished.  Non-toxic appearance. He does not appear ill. No distress.  HENT:  Head: Normocephalic and atraumatic.  Nose: Nose normal.  Mouth/Throat: Oropharynx is clear and moist. No oropharyngeal exudate.  Eyes: Conjunctivae and EOM are normal. Pupils are equal, round, and reactive to light. No scleral icterus.  Neck: Normal range of motion. Neck supple. No tracheal deviation, no edema, no erythema and normal range of motion present. No thyroid mass and no thyromegaly present.  Cardiovascular: Normal rate, regular rhythm, S1 normal, S2 normal, normal heart sounds, intact distal pulses and normal pulses.  Exam reveals no gallop and no friction rub.   No murmur heard. Pulmonary/Chest: Effort normal and breath sounds normal. No respiratory distress. He has no wheezes. He has no rhonchi. He has no rales.  Abdominal: Soft. Normal appearance and bowel sounds are normal. He exhibits no distension, no ascites and no mass. There is no hepatosplenomegaly. There is no tenderness. There is no rebound, no guarding and no CVA tenderness.  Musculoskeletal: Normal range of motion. He exhibits edema (bilat LE). He exhibits no tenderness.  Lymphadenopathy:    He has no cervical adenopathy.  Neurological: He is alert and oriented to person, place, and time. He has normal strength. No cranial nerve deficit or sensory deficit.  Skin: Skin is warm, dry and intact. No petechiae and no rash noted. He is not diaphoretic. No erythema. No pallor.  Nursing note and vitals reviewed.  ED Treatments / Results  DIAGNOSTIC STUDIES: Oxygen Saturation is 99% on RA, normal by my interpretation.   COORDINATION OF CARE: 12:39 AM-Discussed next steps with  pt. Pt verbalized understanding and is agreeable with the plan.   Labs (all labs ordered are listed, but only abnormal results are displayed) Labs Reviewed  BASIC METABOLIC PANEL - Abnormal; Notable for the following:       Result Value   Creatinine, Ser 1.58 (*)    GFR calc non Af Amer 52 (*)    All other components within normal limits  BRAIN NATRIURETIC PEPTIDE - Abnormal; Notable for the following:    B Natriuretic Peptide 1,950.6 (*)    All other components within normal limits  CBC WITH DIFFERENTIAL/PLATELET - Abnormal; Notable for the following:    WBC 10.6 (*)    Hemoglobin 12.0 (*)    HCT 38.2 (*)    MCH 25.8 (*)    Neutro Abs 8.5 (*)    All other components within normal limits  I-STAT TROPOININ, ED - Abnormal; Notable for the following:  Troponin i, poc 0.15 (*)    All other components within normal limits  MAGNESIUM  I-STAT TROPOININ, ED    EKG  EKG Interpretation  Date/Time:  Thursday November 23 2015 00:22:48 EDT Ventricular Rate:  88 PR Interval:    QRS Duration: 117 QT Interval:  376 QTC Calculation: 455 R Axis:   23 Text Interpretation:  Sinus rhythm Multiple ventricular premature complexes Biatrial enlargement LVH with secondary repolarization abnormality new TWI Confirmed by Erroll LunaOni, Anisha Starliper Ayokunle [phone]([id number]) on 11/23/2015 12:32:10 AM       Radiology Dg Chest 2 View  Result Date: 11/23/2015 CLINICAL DATA:  Shortness of breath for 2 days. EXAM: CHEST  2 VIEW COMPARISON:  05/29/2015 FINDINGS: Cardiac enlargement with mild vascular congestion. No evidence of consolidation or edema. No blunting of costophrenic angles. No pneumothorax. Mediastinal contours are intact. IMPRESSION: Cardiac enlargement with vascular congestion. No focal consolidation or edema. Electronically Signed   By: Burman NievesWilliam  Stevens M.D.   On: 11/23/2015 01:48    Procedures Procedures (including critical care time)  Medications Ordered in ED Medications  aspirin tablet 325 mg (325 mg  Oral Not Given 11/23/15 0119)     Initial Impression / Assessment and Plan / ED Course  I have reviewed the triage vital signs and the nursing notes.  Pertinent labs & imaging results that were available during my care of the patient were reviewed by me and considered in my medical decision making (see chart for details).  Clinical Course    Patient presents to the ED for CP.  History is concerning for ACS.  EKG shows new TWI.    2:07 AM Troponin is positive at 0.15.  I spoke with Dr. Algie CofferKadakia who will admit patient for NSTEMI.  He already took ASA today and currently does not have any CP in the room.     CRITICAL CARE Performed by: Tomasita CrumbleNI,Kadeidra Coryell   Total critical care time: 35 minutes - NSTEMI  Critical care time was exclusive of separately billable procedures and treating other patients.  Critical care was necessary to treat or prevent imminent or life-threatening deterioration.  Critical care was time spent personally by me on the following activities: development of treatment plan with patient and/or surrogate as well as nursing, discussions with consultants, evaluation of patient's response to treatment, examination of patient, obtaining history from patient or surrogate, ordering and performing treatments and interventions, ordering and review of laboratory studies, ordering and review of radiographic studies, pulse oximetry and re-evaluation of patient's condition.   Final Clinical Impressions(s) / ED Diagnoses   Final diagnoses:  None    New Prescriptions New Prescriptions   No medications on file   I personally performed the services described in this documentation, which was scribed in my presence. The recorded information has been reviewed and is accurate.       Tomasita CrumbleAdeleke Ayen Viviano, MD 11/23/15 [phone]

## 2015-11-23 NOTE — Plan of Care (Signed)
Problem: Education: Goal: Ability to verbalize understanding of medication therapies will improve Outcome: Completed/Met Date Met: 11/23/15 Medication adjustments ordered today and all changes reviewed with patient

## 2015-11-23 NOTE — ED Notes (Signed)
Patient transported to X-ray 

## 2015-11-23 NOTE — ED Notes (Signed)
Pt BP high since arrival, BP medication given as ordered, Dr. Algie Coffer aware of elevated BP pt to go to Telemetry per Dr. Algie Coffer, cardiology.

## 2015-11-23 NOTE — Progress Notes (Addendum)
Patient refused Coreg stating it causes him trouble breathing.  Dr. Algie Coffer to room rounding at this time and stated he will adjust medications and discontinue the Coreg.  Dr. Algie Coffer stated he will increase and adjust medications for elevated b/p.

## 2015-11-23 NOTE — ED Triage Notes (Signed)
Pt brought to ED for c/o CP 5/10 with some nausea, vomiting and SOB, new diagnose CHF on April this year 324 mg ASA given by EMS denies any CP on arrival to ED but still feeling SOB, pt states lately he needs to sleep sitting up secondary to SOB. BP 167/121, HR -90, R-22, SPO2 100% on 2L Derma.

## 2015-11-23 NOTE — H&P (Signed)
Referring Physician:  Owens Hara is an 45 y.o. male.                       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 Chest x-ray shows cardiomegaly and vascular congestion.  Past Medical History:  Diagnosis Date  . Chronic systolic CHF (congestive heart failure) ([place])    a. echo 3/17 - Moderate LVH, EF 25-30%, diffuse HK, mild MR, mild LAE  . Hypertension   . MS (multiple sclerosis) ([place])   . Non-ischemic cardiomyopathy ([place])    a. LHC 3/17 - normal cor arteries      Past Surgical History:  Procedure Laterality Date  . CARDIAC CATHETERIZATION N/A 06/01/2015   Procedure: Right/Left Heart Cath and Coronary Angiography;  Surgeon: Leonie Man, MD;  Location: [facility];  Service: Cardiovascular;  Laterality: N/A;  . GALLBLADDER SURGERY     2009    Family History  Problem Relation Age of Onset  . High blood pressure Mother   . High blood pressure Father    Social History:  reports that he has been smoking Cigarettes.  He has a 10.00 pack-year smoking history. He has never used smokeless tobacco. He reports that he drinks alcohol. He reports that he does not use drugs.  Allergies: No Known Allergies   (Not in a hospital admission)  Results for orders placed or performed during the hospital encounter of 11/23/15 (from the past 48 hour(s))  Basic metabolic panel     Status: Abnormal   Collection Time: 11/23/15 12:30 AM  Result Value Ref Range   Sodium 141 135 - 145 mmol/L   Potassium 3.7 3.5 - 5.1 mmol/L   Chloride 106 101 - 111 mmol/L   CO2 28 22 - 32 mmol/L   Glucose, Bld 99 65 - 99 mg/dL   BUN 18 6 - 20 mg/dL   Creatinine, Ser 1.58 (H) 0.61 - 1.24 mg/dL   Calcium 9.1 8.9 - 10.3 mg/dL   GFR calc non Af Amer 52 (L) >60 mL/min   GFR calc Af Amer >60  >60 mL/min    Comment: (NOTE) The eGFR has been calculated using the CKD EPI equation. This calculation has not been validated in all clinical situations. eGFR's persistently <60 mL/min signify possible Chronic Kidney Disease.    Anion gap 7 5 - 15  Magnesium     Status: None   Collection Time: 11/23/15 12:30 AM  Result Value Ref Range   Magnesium 1.9 1.7 - 2.4 mg/dL  CBC with Differential/Platelet     Status: Abnormal   Collection Time: 11/23/15 12:34 AM  Result Value Ref Range   WBC 10.6 (H) 4.0 - 10.5 K/uL   RBC 4.66 4.22 - 5.81 MIL/uL   Hemoglobin 12.0 (L) 13.0 - 17.0 g/dL   HCT 38.2 (L) 39.0 - 52.0 %   MCV 82.0 78.0 - 100.0 fL   MCH 25.8 (L) 26.0 - 34.0 pg   MCHC 31.4 30.0 - 36.0 g/dL   RDW 15.0 11.5 - 15.5 %   Platelets 198 150 - 400 K/uL   Neutrophils Relative % 79 %   Neutro Abs 8.5 (H) 1.7 - 7.7 K/uL   Lymphocytes Relative 11 %   Lymphs Abs 1.1 0.7 - 4.0  K/uL   Monocytes Relative 8 %   Monocytes Absolute 0.8 0.1 - 1.0 K/uL   Eosinophils Relative 2 %   Eosinophils Absolute 0.2 0.0 - 0.7 K/uL   Basophils Relative 0 %   Basophils Absolute 0.0 0.0 - 0.1 K/uL  Brain natriuretic peptide     Status: Abnormal   Collection Time: 11/23/15 12:42 AM  Result Value Ref Range   B Natriuretic Peptide 1,950.6 (H) 0.0 - 100.0 pg/mL  I-stat troponin, ED     Status: Abnormal   Collection Time: 11/23/15 12:50 AM  Result Value Ref Range   Troponin i, poc 0.15 (HH) 0.00 - 0.08 ng/mL   Comment NOTIFIED PHYSICIAN    Comment 3            Comment: Due to the release kinetics of cTnI, a negative result within the first hours of the onset of symptoms does not rule out myocardial infarction with certainty. If myocardial infarction is still suspected, repeat the test at appropriate intervals.    Dg Chest 2 View  Result Date: 11/23/2015 CLINICAL DATA:  Shortness of breath for 2 days. EXAM: CHEST  2 VIEW COMPARISON:  05/29/2015 FINDINGS: Cardiac enlargement with mild vascular  congestion. No evidence of consolidation or edema. No blunting of costophrenic angles. No pneumothorax. Mediastinal contours are intact. IMPRESSION: Cardiac enlargement with vascular congestion. No focal consolidation or edema. Electronically Signed   By: Lucienne Capers M.D.   On: 11/23/2015 01:48    Review Of Systems Constitutional: No fever or chills. HENT: negative. Respiratory: No COPD or asthma. Cardiovascular: Positive chest pain and shortness of breath.  Gastrointestinal: Negative for GI bleed, hepatitis or vomiting or diarrhea. Positive constipation. Musculoskeletal: Positive for myalgias and back pain. Skin: No rash. Neurological: Positive weakness, dizziness, No stroke or seizures. Endocrinology: Negative.  Blood pressure (!) 194/138, pulse 95, temperature 98.3 F (36.8 C), temperature source Oral, resp. rate 19, height 6' (1.829 m), weight 115.7 kg (255 lb), SpO2 94 %. General appearance: cooperative, appears stated age and mild distress Head: Normocephalic, atraumatic Eyes: Brown eyes, conjunctivae pink, corneas clear. PERRL, EOM's intact. Neck: No adenopathy, No carotid bruit, + JVD at 30 degree angle, supple, trachea midline and thyroid not enlarged. Resp: Few basal crackles on auscultation bilaterally. Cardio: Regular rate and rhythm, S1, S2 normal, II/VI systolic murmur, no click, rub or gallop. GI: soft, non-tender; bowel sounds normal; no masses,  no organomegaly. Extremities: extremities normal, atraumatic, no cyanosis or edema Skin: Skin color, texture, turgor normal. No rashes or lesions Neurologic: Alert and oriented X 3, normal strength and tone. Normal coordination.  Assessment/Plan Acute on chronic left heart systolic failure Dilated non-ischemic cardiomyopathy Hypertension, uncontrolled Multiple sclerosis CKD, III H/O non-compliance with medications  Admit/Home medications/R/O MI  [facility] S, MD  11/23/2015, 2:11 AM

## 2015-11-23 NOTE — Progress Notes (Signed)
CRITICAL VALUE ALERT  Critical value received:  Troponin 0.19  Date of notification:  11/23/15  Time of notification:  0442  Critical value read back:Yes.    Nurse who received alert:  Leanor Kail  MD notified (1st page):  Dr. Algie Coffer  Time of first page:  [phone]  MD notified (2nd page):  Time of second page:  Responding MD:  Dr. Algie Coffer  Time MD responded:  [phone]

## 2015-11-23 NOTE — Progress Notes (Signed)
Pt educated about 1200 ml fluid restriction for 24hours with understanding, pt non-compliant of fluid restriction

## 2015-11-24 LAB — BASIC METABOLIC PANEL
Anion gap: 9 (ref 5–15)
BUN: 18 mg/dL (ref 6–20)
CHLORIDE: 101 mmol/L (ref 101–111)
CO2: 30 mmol/L (ref 22–32)
CREATININE: 1.66 mg/dL — AB (ref 0.61–1.24)
Calcium: 9.3 mg/dL (ref 8.9–10.3)
GFR calc non Af Amer: 49 mL/min — ABNORMAL LOW (ref >60)
GFR, EST AFRICAN AMERICAN: 57 mL/min — AB (ref >60)
GLUCOSE: 96 mg/dL (ref 65–99)
Potassium: 3.6 mmol/L (ref 3.5–5.1)
Sodium: 140 mmol/L (ref 135–145)

## 2015-11-24 LAB — GLUCOSE, CAPILLARY
GLUCOSE-CAPILLARY: 119 mg/dL — AB (ref 65–99)
Glucose-Capillary: 80 mg/dL (ref 65–99)
Glucose-Capillary: 114 mg/dL — ABNORMAL HIGH (ref 65–99)

## 2015-11-24 MED ORDER — HYDRALAZINE HCL 25 MG PO TABS
25.0000 mg | ORAL_TABLET | Freq: Three times a day (TID) | ORAL | Status: DC
  Administered 2015-11-24 – 2015-11-25 (×4): 25 mg via ORAL
  Filled 2015-11-24 (×4): qty 1

## 2015-11-24 MED ORDER — CLONIDINE HCL 0.1 MG PO TABS: 0.1000 mg | ORAL_TABLET | Freq: Every day | ORAL | Status: DC

## 2015-11-24 MED ORDER — AMLODIPINE BESYLATE 5 MG PO TABS
5.0000 mg | ORAL_TABLET | Freq: Every day | ORAL | Status: DC
  Administered 2015-11-24 – 2015-11-26 (×3): 5 mg via ORAL
  Filled 2015-11-24 (×3): qty 1

## 2015-11-24 MED ORDER — ASPIRIN EC 81 MG PO TBEC
81.0000 mg | DELAYED_RELEASE_TABLET | Freq: Every day | ORAL | Status: DC
  Administered 2015-11-24 – 2015-11-26 (×3): 81 mg via ORAL
  Filled 2015-11-24 (×3): qty 1

## 2015-11-24 MED ORDER — FUROSEMIDE 10 MG/ML IJ SOLN
40.0000 mg | Freq: Every day | INTRAMUSCULAR | Status: DC
  Administered 2015-11-25: 40 mg via INTRAVENOUS
  Filled 2015-11-24 (×2): qty 4

## 2015-11-24 NOTE — Hospital Discharge Follow-Up (Signed)
[facility] at [place]:  This Case Manager spoke with Olga Coaster, RN CM, and it was determined patient may benefit from post-discharge follow-up with the [facility] and [facility]. This Case Manager met with patient at bedside to discuss follow-up, case management services provided at the [facility]. Patient declined follow-up with the [facility] and indicated he planned to follow-up with his PCP. Olga Coaster, RN CM updated.

## 2015-11-24 NOTE — Progress Notes (Signed)
   11/24/15 1300  Clinical Encounter Type  Visited With Patient;Patient and family together (Pt. followed by pt. & son together)  Visit Type Initial  Stress Factors  Patient Stress Factors Other (Comment) (Boredom)   Introduced chaplaincy services   - Pt. expressed feelings of boredom   - Chaplain suggested puzzle books to pt. and bedside Nurse (for pt.)  - Pt. family is source of joy and meaning.   - Will follow, as needed.  - Rev. Chaplain Kipp Brood MDiv ThM

## 2015-11-24 NOTE — Progress Notes (Signed)
Ref: Default, Provider, MD   Subjective:  Feeling better. Understood need to comply fluid intake and medications.  Objective:  Vital Signs in the last 24 hours: Temp:  [98.2 F (36.8 C)-98.8 F (37.1 C)] 98.2 F (36.8 C) (09/22 0444) Pulse Rate:  [72-87] 76 (09/22 0444) Cardiac Rhythm: Normal sinus rhythm (09/22 0730) Resp:  [16-19] 18 (09/22 0444) BP: (113-169)/(83-126) 116/89 (09/22 0444) SpO2:  [95 %-100 %] 97 % (09/22 0444) Weight:  [111.7 kg (246 lb 4.8 oz)] 111.7 kg (246 lb 4.8 oz) (09/22 0444)  Physical Exam: BP Readings from Last 1 Encounters:  11/24/15 116/89    Wt Readings from Last 1 Encounters:  11/24/15 111.7 kg (246 lb 4.8 oz)    Weight change: -3.946 kg (-8 lb 11.2 oz)  HEENT: [place]/AT, Eyes-Brown, PERL, EOMI, Conjunctiva-Pink, Sclera-Non-icteric Neck: No JVD, No bruit, Trachea midline. Lungs:  Clearing, Bilateral. Cardiac:  Regular rhythm, normal S1 and S2, no S3. II/VI systolic murmur. Abdomen:  Soft, non-tender. Extremities:  1 + left lower leg more than right, edema present. No cyanosis. No clubbing. CNS: AxOx3, Cranial nerves grossly intact, moves all 4 extremities.  Skin: Warm and dry.   Intake/Output from previous day: 09/21 0701 - 09/22 0700 In: 1625 [P.O.:1625] Out: 6805 [Urine:6805]    Lab Results: BMET    Component Value Date/Time   NA 140 11/24/2015 0343   NA 141 11/23/2015 0030   NA 138 06/26/2015 1527   K 3.6 11/24/2015 0343   K 3.7 11/23/2015 0030   K 4.2 06/26/2015 1527   CL 101 11/24/2015 0343   CL 106 11/23/2015 0030   CL 103 06/26/2015 1527   CO2 30 11/24/2015 0343   CO2 28 11/23/2015 0030   CO2 27 06/26/2015 1527   GLUCOSE 96 11/24/2015 0343   GLUCOSE 99 11/23/2015 0030   GLUCOSE 76 06/26/2015 1527   BUN 18 11/24/2015 0343   BUN 18 11/23/2015 0030   BUN 12 06/26/2015 1527   CREATININE 1.66 (H) 11/24/2015 0343   CREATININE 1.51 (H) 11/23/2015 0333   CREATININE 1.58 (H) 11/23/2015 0030   CREATININE 1.30 06/26/2015 1527    CALCIUM 9.3 11/24/2015 0343   CALCIUM 9.1 11/23/2015 0030   CALCIUM 9.2 06/26/2015 1527   GFRNONAA 49 (L) 11/24/2015 0343   GFRNONAA 55 (L) 11/23/2015 0333   GFRNONAA 52 (L) 11/23/2015 0030   GFRAA 57 (L) 11/24/2015 0343   GFRAA >60 11/23/2015 0333   GFRAA >60 11/23/2015 0030   CBC    Component Value Date/Time   WBC 11.6 (H) 11/23/2015 0333   RBC 4.75 11/23/2015 0333   HGB 12.1 (L) 11/23/2015 0333   HCT 39.0 11/23/2015 0333   PLT 200 11/23/2015 0333   MCV 82.1 11/23/2015 0333   MCH 25.5 (L) 11/23/2015 0333   MCHC 31.0 11/23/2015 0333   RDW 15.1 11/23/2015 0333   LYMPHSABS 1.1 11/23/2015 0034   MONOABS 0.8 11/23/2015 0034   EOSABS 0.2 11/23/2015 0034   BASOSABS 0.0 11/23/2015 0034   HEPATIC Function Panel  Recent Labs  05/29/15 2237 05/30/15 0624  PROT 6.4* 6.8   HEMOGLOBIN A1C No components found for: HGA1C,  MPG CARDIAC ENZYMES Lab Results  Component Value Date   TROPONINI 0.16 (HH) 11/23/2015   TROPONINI 0.19 (HH) 11/23/2015   TROPONINI 0.11 (H) 05/30/2015   BNP No results for input(s): PROBNP in the last 8760 hours. TSH  Recent Labs  05/30/15 0624  TSH 3.923   CHOLESTEROL No results for input(s): CHOL in the  last 8760 hours.  Scheduled Meds: . amLODipine  5 mg Oral Daily  . aspirin EC  81 mg Oral Daily  . [START ON 11/25/2015] cloNIDine  0.1 mg Oral QHS  . [START ON 11/25/2015] furosemide  40 mg Intravenous Daily  . heparin  5,000 Units Subcutaneous Q8H  . hydrALAZINE  25 mg Oral TID  . losartan  100 mg Oral Daily  . metoprolol tartrate  12.5 mg Oral BID  . sodium chloride flush  3 mL Intravenous Q12H  . spironolactone  12.5 mg Oral BID   Continuous Infusions:  PRN Meds:.sodium chloride, acetaminophen, ondansetron (ZOFRAN) IV, sodium chloride flush  Assessment/Plan: Acute on chronic left heart systolic failure Dilated non-ischemic caridomyopathy Hypertension, improved control Alcohol use disoprder Multiple sclerosis CKD,  III Obesity  Discussed diet, acitivity and fluid intake and checking daily weight.   LOS: 1 day    Orpah Cobb  MD  11/24/2015, 10:08 AM

## 2015-11-24 NOTE — Care Management Note (Signed)
Case Management Note  Patient Details  Name: [name] MRN: [id number] Date of Birth: [birth year]  Subjective/Objective:         Admitted with CHF           Action/Plan: Patient lives at home with his girlfriend and his children; mod independent of his ADL's; has Medicaid with prescription drug coverage; patient states that he will be on a new medication Entresto, coupon card and written literature given to the patient ; pharmacy of choice is [facility]. He has scales and states from now on he will be weighing himself daily; he eats a low sodium diet. No needs identified. CM will continue to follow for DCP  Expected Discharge Date:   possibly 11/27/2015               Expected Discharge Plan:  Home/Self Care  Discharge planning Services  CM Consult     Status of Service:  In process, will continue to follow  Reola Mosher [phone] 11/24/2015, 3:12 PM

## 2015-11-25 LAB — BASIC METABOLIC PANEL
Anion gap: 10 (ref 5–15)
BUN: 19 mg/dL (ref 6–20)
CO2: 30 mmol/L (ref 22–32)
CREATININE: 1.68 mg/dL — AB (ref 0.61–1.24)
Calcium: 9.3 mg/dL (ref 8.9–10.3)
Chloride: 100 mmol/L — ABNORMAL LOW (ref 101–111)
GFR, EST AFRICAN AMERICAN: 56 mL/min — AB (ref >60)
GFR, EST NON AFRICAN AMERICAN: 48 mL/min — AB (ref >60)
Glucose, Bld: 95 mg/dL (ref 65–99)
POTASSIUM: 3.8 mmol/L (ref 3.5–5.1)
SODIUM: 140 mmol/L (ref 135–145)

## 2015-11-25 LAB — GLUCOSE, CAPILLARY
GLUCOSE-CAPILLARY: 128 mg/dL — AB (ref 65–99)
Glucose-Capillary: 93 mg/dL (ref 65–99)
Glucose-Capillary: 145 mg/dL — ABNORMAL HIGH (ref 65–99)
Glucose-Capillary: 125 mg/dL — ABNORMAL HIGH (ref 65–99)

## 2015-11-25 MED ORDER — HYDRALAZINE HCL 25 MG PO TABS
25.0000 mg | ORAL_TABLET | Freq: Two times a day (BID) | ORAL | Status: DC
  Administered 2015-11-25 – 2015-11-26 (×2): 25 mg via ORAL
  Filled 2015-11-25 (×3): qty 1

## 2015-11-25 MED ORDER — FUROSEMIDE 40 MG PO TABS
40.0000 mg | ORAL_TABLET | Freq: Every day | ORAL | Status: DC
  Administered 2015-11-26: 40 mg via ORAL
  Filled 2015-11-25: qty 1

## 2015-11-25 MED ORDER — SACUBITRIL-VALSARTAN 24-26 MG PO TABS
1.0000 | ORAL_TABLET | Freq: Two times a day (BID) | ORAL | Status: DC
  Administered 2015-11-25 – 2015-11-26 (×2): 1 via ORAL
  Filled 2015-11-25 (×2): qty 1

## 2015-11-25 NOTE — Progress Notes (Signed)
Ref: Default, Provider, MD   Subjective:  Additional diuresis and most of the leg edema has resolved. Afebrile.  Objective:  Vital Signs in the last 24 hours: Temp:  [97.7 F (36.5 C)-98.6 F (37 C)] 97.7 F (36.5 C) (09/23 1138) Pulse Rate:  [72-79] 72 (09/23 1138) Cardiac Rhythm: Normal sinus rhythm (09/23 0700) Resp:  [16-18] 18 (09/23 1138) BP: (116-134)/(66-89) 117/70 (09/23 1138) SpO2:  [98 %-100 %] 100 % (09/23 1138) Weight:  [111.1 kg (245 lb)] 111.1 kg (245 lb) (09/23 0446)  Physical Exam: BP Readings from Last 1 Encounters:  11/25/15 117/70    Wt Readings from Last 1 Encounters:  11/25/15 111.1 kg (245 lb)    Weight change: -0.59 kg (-1 lb 4.8 oz)  HEENT: [place]/AT, Eyes-Brown, PERL, EOMI, Conjunctiva-Pink, Sclera-Non-icteric Neck: No JVD, No bruit, Trachea midline. Lungs:  Clear, Bilateral. Cardiac:  Regular rhythm, normal S1 and S2, no S3. II/VI systolic murmur. Abdomen:  Soft, non-tender. Extremities:  Trace edema present. No cyanosis. No clubbing. CNS: AxOx3, Cranial nerves grossly intact, moves all 4 extremities.  Skin: Warm and dry.   Intake/Output from previous day: 09/22 0701 - 09/23 0700 In: 1080 [P.O.:1080] Out: 3650 [Urine:3650]    Lab Results: BMET    Component Value Date/Time   NA 140 11/25/2015 0231   NA 140 11/24/2015 0343   NA 141 11/23/2015 0030   K 3.8 11/25/2015 0231   K 3.6 11/24/2015 0343   K 3.7 11/23/2015 0030   CL 100 (L) 11/25/2015 0231   CL 101 11/24/2015 0343   CL 106 11/23/2015 0030   CO2 30 11/25/2015 0231   CO2 30 11/24/2015 0343   CO2 28 11/23/2015 0030   GLUCOSE 95 11/25/2015 0231   GLUCOSE 96 11/24/2015 0343   GLUCOSE 99 11/23/2015 0030   BUN 19 11/25/2015 0231   BUN 18 11/24/2015 0343   BUN 18 11/23/2015 0030   CREATININE 1.68 (H) 11/25/2015 0231   CREATININE 1.66 (H) 11/24/2015 0343   CREATININE 1.51 (H) 11/23/2015 0333   CREATININE 1.30 06/26/2015 1527   CALCIUM 9.3 11/25/2015 0231   CALCIUM 9.3 11/24/2015  0343   CALCIUM 9.1 11/23/2015 0030   GFRNONAA 48 (L) 11/25/2015 0231   GFRNONAA 49 (L) 11/24/2015 0343   GFRNONAA 55 (L) 11/23/2015 0333   GFRAA 56 (L) 11/25/2015 0231   GFRAA 57 (L) 11/24/2015 0343   GFRAA >60 11/23/2015 0333   CBC    Component Value Date/Time   WBC 11.6 (H) 11/23/2015 0333   RBC 4.75 11/23/2015 0333   HGB 12.1 (L) 11/23/2015 0333   HCT 39.0 11/23/2015 0333   PLT 200 11/23/2015 0333   MCV 82.1 11/23/2015 0333   MCH 25.5 (L) 11/23/2015 0333   MCHC 31.0 11/23/2015 0333   RDW 15.1 11/23/2015 0333   LYMPHSABS 1.1 11/23/2015 0034   MONOABS 0.8 11/23/2015 0034   EOSABS 0.2 11/23/2015 0034   BASOSABS 0.0 11/23/2015 0034   HEPATIC Function Panel  Recent Labs  05/29/15 2237 05/30/15 0624  PROT 6.4* 6.8   HEMOGLOBIN A1C No components found for: HGA1C,  MPG CARDIAC ENZYMES Lab Results  Component Value Date   TROPONINI 0.16 (HH) 11/23/2015   TROPONINI 0.19 (HH) 11/23/2015   TROPONINI 0.11 (H) 05/30/2015   BNP No results for input(s): PROBNP in the last 8760 hours. TSH  Recent Labs  05/30/15 0624  TSH 3.923   CHOLESTEROL No results for input(s): CHOL in the last 8760 hours.  Scheduled Meds: . amLODipine  5  mg Oral Daily  . aspirin EC  81 mg Oral Daily  . [START ON 11/26/2015] furosemide  40 mg Oral Daily  . heparin  5,000 Units Subcutaneous Q8H  . hydrALAZINE  25 mg Oral BID  . metoprolol tartrate  12.5 mg Oral BID  . sacubitril-valsartan  1 tablet Oral BID  . sodium chloride flush  3 mL Intravenous Q12H  . spironolactone  12.5 mg Oral BID   Continuous Infusions:  PRN Meds:.sodium chloride, acetaminophen, ondansetron (ZOFRAN) IV, sodium chloride flush  Assessment/Plan: Acute on chronic left heart systolic failure Dilated and non-ischemic cardiomyopathy Hypertension Alcohol use disorder Mutiple sclerosis CKD, III Obesity  Start Entresto. Increase activity.  Discontinue clonidine.  Change lasix to PO.   LOS: 2 days    Orpah Cobb  MD  11/25/2015, 3:08 PM

## 2015-11-26 LAB — BASIC METABOLIC PANEL
ANION GAP: 7 (ref 5–15)
BUN: 17 mg/dL (ref 6–20)
CO2: 26 mmol/L (ref 22–32)
Calcium: 9 mg/dL (ref 8.9–10.3)
Chloride: 104 mmol/L (ref 101–111)
Creatinine, Ser: 1.43 mg/dL — ABNORMAL HIGH (ref 0.61–1.24)
GFR calc Af Amer: >60 mL/min (ref >60)
GFR, EST NON AFRICAN AMERICAN: 59 mL/min — AB (ref >60)
Glucose, Bld: 100 mg/dL — ABNORMAL HIGH (ref 65–99)
POTASSIUM: 4 mmol/L (ref 3.5–5.1)
SODIUM: 137 mmol/L (ref 135–145)

## 2015-11-26 LAB — GLUCOSE, CAPILLARY
GLUCOSE-CAPILLARY: 105 mg/dL — AB (ref 65–99)
Glucose-Capillary: 92 mg/dL (ref 65–99)
Glucose-Capillary: 91 mg/dL (ref 65–99)

## 2015-11-26 MED ORDER — SACUBITRIL-VALSARTAN 24-26 MG PO TABS: 1.0000 | ORAL_TABLET | Freq: Two times a day (BID) | ORAL | 3 refills | Status: DC

## 2015-11-26 MED ORDER — FUROSEMIDE 40 MG PO TABS: 40.0000 mg | ORAL_TABLET | Freq: Every day | ORAL | 3 refills | Status: AC

## 2015-11-26 MED ORDER — AMLODIPINE BESYLATE 5 MG PO TABS: 5.0000 mg | ORAL_TABLET | Freq: Every day | ORAL | 3 refills | Status: AC

## 2015-11-26 MED ORDER — SPIRONOLACTONE 25 MG PO TABS: 12.5000 mg | ORAL_TABLET | Freq: Two times a day (BID) | ORAL | 3 refills | Status: DC

## 2015-11-26 MED ORDER — METOPROLOL TARTRATE 25 MG PO TABS: 12.5000 mg | ORAL_TABLET | Freq: Two times a day (BID) | ORAL | 3 refills | Status: DC

## 2015-11-26 NOTE — Discharge Summary (Signed)
Physician Discharge Summary  Patient ID: [name] MRN: [id number] DOB/AGE: [birth year] 45 y.o.  Admit date: 11/23/2015 Discharge date: 11/26/2015  Admission Diagnoses: Acute on chronic left systolic heart failure Dilated and non-ischemic cardiomyopathy Hypertension Alcohol use disorder Multiple sclerosis CKD, III Obesity  Discharge Diagnoses:  Active Problems:   Acute on chronic left systolic heart failure (HCC) Dilated and non-ischemic cardiomyopathy Hypertension Alcohol use disorder Multiple sclerosis CKD, III Obesity  Discharged Condition: good  Hospital Course: 45 years old male with past history of CHF, hypertension, multiple sclerosis, non-ischemic cardiomyopathy and CKD, III had shortness of breath, elevated BNP and leg edema. He responded very well to IV lasix followed by PO lasix. His ACE inhibitor was changed to [employer]. His renal function remained stable and will be monitored periodically. He tolerated metoprolol over carvedilol. Patient understood importance of taking medications regularly, follow heart healthy diet and decrease fluid intake by 50 % and give up alcohol use. He will see in 1 week.  Consults: cardiology  Significant Diagnostic Studies: labs: Near normal CBC and BMET except Creatinine of 1.43 to 1.66. Minimally elevated Troponin I levels from demand ischemia. BNP was elevated at 1950.6 pg.  EKG: Sinus rhythm, LVH, bi-atrial enlargement and Frequent PVCs.  Chest X-ray: Cardiac enlargement with vascular congestion.  Treatments: cardiac meds: Entresto, metoprolol, amlodipine, furosemide and spironolactone.  Discharge Exam: Blood pressure (!) 144/93, pulse 65, temperature 97.8 F (36.6 C), temperature source Oral, resp. rate 20, height 6' (1.829 m), weight 109.9 kg (242 lb 4.8 oz), SpO2 100 %. General appearance: alert, cooperative, appears stated age and no distress Head: Normocephalic, atraumatic Eyes: Brown eyes, Pink conjunctivae, corneas  clear. PERRL, EOM's intact.  Neck: no adenopathy, no carotid bruit, no JVD, supple, symmetrical, trachea midline and thyroid not enlarged. Resp: clear to auscultation bilaterally. Cardio: regular rate and rhythm, S1, S2 normal, II/VI systolic murmur. No click, rub or gallop GI: soft, non-tender; bowel sounds normal; no masses,  no organomegaly Extremities: extremities normal, atraumatic, no cyanosis or edema Skin: Skin color, texture, turgor normal. No rashes or lesions Neurologic: Alert and oriented X 3, normal strength and tone. Normal coordination and gait.  Disposition: 01-Home or Self Care     Medication List    STOP taking these medications   carvedilol 12.5 MG tablet Commonly known as:  COREG   lisinopril 10 MG tablet Commonly known as:  PRINIVIL,ZESTRIL     TAKE these medications   amLODipine 5 MG tablet Commonly known as:  NORVASC Take 1 tablet (5 mg total) by mouth daily. Start taking on:  11/27/2015   aspirin 81 MG EC tablet Take 1 tablet (81 mg total) by mouth daily.   furosemide 40 MG tablet Commonly known as:  LASIX Take 1 tablet (40 mg total) by mouth daily. What changed:  medication strength  how much to take   hydrALAZINE 50 MG tablet Commonly known as:  APRESOLINE Take 1 tablet (50 mg total) by mouth 3 (three) times daily.   metoprolol tartrate 25 MG tablet Commonly known as:  LOPRESSOR Take 0.5 tablets (12.5 mg total) by mouth 2 (two) times daily.   sacubitril-valsartan 24-26 MG Commonly known as:  ENTRESTO Take 1 tablet by mouth 2 (two) times daily.   spironolactone 25 MG tablet Commonly known as:  ALDACTONE Take 0.5 tablets (12.5 mg total) by mouth 2 (two) times daily. What changed:  when to take this      Follow-up Information    [facility] S, MD Follow up in 1  week(s).   Specialty:  Cardiology Contact information: [address] [place] [address] [phone]           Signed: Ricki Rodriguez 11/26/2015, 11:19  AM

## 2016-09-13 ENCOUNTER — Other Ambulatory Visit: Payer: Self-pay | Admitting: Physician Assistant

## 2016-09-13 DIAGNOSIS — I5022 Chronic systolic (congestive) heart failure: Secondary | ICD-10-CM

## 2016-11-29 ENCOUNTER — Emergency Department (HOSPITAL_COMMUNITY): Payer: Medicaid Other

## 2016-11-29 ENCOUNTER — Ambulatory Visit (HOSPITAL_COMMUNITY)
Admission: EM | Disposition: A | Discharge status: Home / Self Care | Payer: Self-pay | Source: Home / Self Care | Attending: Emergency Medicine

## 2016-11-29 ENCOUNTER — Observation Stay (HOSPITAL_COMMUNITY)
Admission: EM | Admit: 2016-11-29 | Discharge: 2016-11-30 | Disposition: A | Discharge status: Home / Self Care | Payer: Medicaid Other | Attending: Cardiovascular Disease | Admitting: Cardiovascular Disease

## 2016-11-29 ENCOUNTER — Encounter (HOSPITAL_COMMUNITY): Payer: Self-pay | Admitting: Emergency Medicine

## 2016-11-29 DIAGNOSIS — G35 Multiple sclerosis: Secondary | ICD-10-CM | POA: Diagnosis not present

## 2016-11-29 DIAGNOSIS — R072 Precordial pain: Secondary | ICD-10-CM | POA: Diagnosis present

## 2016-11-29 DIAGNOSIS — I130 Hypertensive heart and chronic kidney disease with heart failure and stage 1 through stage 4 chronic kidney disease, or unspecified chronic kidney disease: Secondary | ICD-10-CM | POA: Insufficient documentation

## 2016-11-29 DIAGNOSIS — I420 Dilated cardiomyopathy: Secondary | ICD-10-CM | POA: Insufficient documentation

## 2016-11-29 DIAGNOSIS — N182 Chronic kidney disease, stage 2 (mild): Secondary | ICD-10-CM | POA: Diagnosis not present

## 2016-11-29 DIAGNOSIS — E785 Hyperlipidemia, unspecified: Secondary | ICD-10-CM | POA: Insufficient documentation

## 2016-11-29 DIAGNOSIS — F17210 Nicotine dependence, cigarettes, uncomplicated: Secondary | ICD-10-CM | POA: Insufficient documentation

## 2016-11-29 DIAGNOSIS — E7439 Other disorders of intestinal carbohydrate absorption: Secondary | ICD-10-CM | POA: Insufficient documentation

## 2016-11-29 DIAGNOSIS — I422 Other hypertrophic cardiomyopathy: Secondary | ICD-10-CM | POA: Insufficient documentation

## 2016-11-29 DIAGNOSIS — R9431 Abnormal electrocardiogram [ECG] [EKG]: Secondary | ICD-10-CM

## 2016-11-29 DIAGNOSIS — Z9119 Patient's noncompliance with other medical treatment and regimen: Secondary | ICD-10-CM | POA: Insufficient documentation

## 2016-11-29 DIAGNOSIS — Z7982 Long term (current) use of aspirin: Secondary | ICD-10-CM | POA: Diagnosis not present

## 2016-11-29 DIAGNOSIS — I5022 Chronic systolic (congestive) heart failure: Secondary | ICD-10-CM | POA: Diagnosis not present

## 2016-11-29 DIAGNOSIS — R61 Generalized hyperhidrosis: Secondary | ICD-10-CM | POA: Diagnosis not present

## 2016-11-29 DIAGNOSIS — I119 Hypertensive heart disease without heart failure: Secondary | ICD-10-CM

## 2016-11-29 HISTORY — PX: LEFT HEART CATH AND CORONARY ANGIOGRAPHY: CATH118249

## 2016-11-29 LAB — COMPREHENSIVE METABOLIC PANEL
ALT: 14 U/L — ABNORMAL LOW (ref 17–63)
ANION GAP: 12 (ref 5–15)
AST: 22 U/L (ref 15–41)
Albumin: 4.2 g/dL (ref 3.5–5.0)
Alkaline Phosphatase: 60 U/L (ref 38–126)
BUN: 13 mg/dL (ref 6–20)
CALCIUM: 9 mg/dL (ref 8.9–10.3)
CHLORIDE: 105 mmol/L (ref 101–111)
CO2: 19 mmol/L — ABNORMAL LOW (ref 22–32)
CREATININE: 1.2 mg/dL (ref 0.61–1.24)
Glucose, Bld: 107 mg/dL — ABNORMAL HIGH (ref 65–99)
POTASSIUM: 3.6 mmol/L (ref 3.5–5.1)
SODIUM: 136 mmol/L (ref 135–145)
TOTAL PROTEIN: 7.3 g/dL (ref 6.5–8.1)
Total Bilirubin: 0.8 mg/dL (ref 0.3–1.2)

## 2016-11-29 LAB — CBC WITH DIFFERENTIAL/PLATELET
BASOS ABS: 0 10*3/uL (ref 0.0–0.1)
BASOS PCT: 0 %
EOS PCT: 4 %
Eosinophils Absolute: 0.3 10*3/uL (ref 0.0–0.7)
HEMATOCRIT: 44.1 % (ref 39.0–52.0)
Hemoglobin: 14.4 g/dL (ref 13.0–17.0)
Lymphocytes Relative: 24 %
Lymphs Abs: 2.2 10*3/uL (ref 0.7–4.0)
MCH: 26 pg (ref 26.0–34.0)
MCHC: 32.7 g/dL (ref 30.0–36.0)
MCV: 79.7 fL (ref 78.0–100.0)
MONO ABS: 0.6 10*3/uL (ref 0.1–1.0)
Monocytes Relative: 7 %
NEUTROS ABS: 6.2 10*3/uL (ref 1.7–7.7)
Neutrophils Relative %: 65 %
PLATELETS: 222 10*3/uL (ref 150–400)
RBC: 5.53 MIL/uL (ref 4.22–5.81)
RDW: 14.1 % (ref 11.5–15.5)
WBC: 9.4 10*3/uL (ref 4.0–10.5)

## 2016-11-29 LAB — I-STAT CHEM 8, ED
BUN: 15 mg/dL (ref 6–20)
CHLORIDE: 104 mmol/L (ref 101–111)
Calcium, Ion: 1.01 mmol/L — ABNORMAL LOW (ref 1.15–1.40)
Creatinine, Ser: 1.2 mg/dL (ref 0.61–1.24)
Glucose, Bld: 108 mg/dL — ABNORMAL HIGH (ref 65–99)
HCT: 47 % (ref 39.0–52.0)
HEMOGLOBIN: 16 g/dL (ref 13.0–17.0)
POTASSIUM: 3.4 mmol/L — AB (ref 3.5–5.1)
SODIUM: 140 mmol/L (ref 135–145)
TCO2: 24 mmol/L (ref 22–32)

## 2016-11-29 LAB — POCT ACTIVATED CLOTTING TIME: Activated Clotting Time: 164 seconds

## 2016-11-29 LAB — LIPID PANEL
Cholesterol: 163 mg/dL (ref 0–200)
HDL: 32 mg/dL — ABNORMAL LOW (ref >40)
LDL CALC: 113 mg/dL — AB (ref 0–99)
Total CHOL/HDL Ratio: 5.1 RATIO
Triglycerides: 89 mg/dL (ref <150)
VLDL: 18 mg/dL (ref 0–40)

## 2016-11-29 LAB — I-STAT TROPONIN, ED: Troponin i, poc: 0.06 ng/mL (ref 0.00–0.08)

## 2016-11-29 LAB — APTT: APTT: 32 s (ref 24–36)

## 2016-11-29 LAB — ECHOCARDIOGRAM COMPLETE: WEIGHTICAEL: 4160 [oz_av]

## 2016-11-29 LAB — PROTIME-INR
INR: 1.04
Prothrombin Time: 13.5 seconds (ref 11.4–15.2)

## 2016-11-29 LAB — TROPONIN I: Troponin I: 0.05 ng/mL (ref <0.03)

## 2016-11-29 SURGERY — LEFT HEART CATH AND CORONARY ANGIOGRAPHY
Anesthesia: LOCAL

## 2016-11-29 MED ORDER — ONDANSETRON HCL 4 MG/2ML IJ SOLN: 4.0000 mg | Freq: Four times a day (QID) | INTRAMUSCULAR | Status: DC | PRN

## 2016-11-29 MED ORDER — SODIUM CHLORIDE 0.9% FLUSH: 3.0000 mL | INTRAVENOUS | Status: DC | PRN

## 2016-11-29 MED ORDER — ACETAMINOPHEN 325 MG PO TABS
650.0000 mg | ORAL_TABLET | ORAL | Status: DC | PRN
  Administered 2016-11-30: 650 mg via ORAL
  Filled 2016-11-29: qty 2

## 2016-11-29 MED ORDER — BACLOFEN 10 MG PO TABS
10.0000 mg | ORAL_TABLET | Freq: Two times a day (BID) | ORAL | Status: DC
  Administered 2016-11-29 – 2016-11-30 (×2): 10 mg via ORAL
  Filled 2016-11-29 (×3): qty 1

## 2016-11-29 MED ORDER — LORATADINE 10 MG PO TABS
10.0000 mg | ORAL_TABLET | Freq: Every day | ORAL | Status: DC
  Administered 2016-11-30: 10 mg via ORAL
  Filled 2016-11-29: qty 1

## 2016-11-29 MED ORDER — SPIRONOLACTONE 25 MG PO TABS
12.5000 mg | ORAL_TABLET | Freq: Two times a day (BID) | ORAL | Status: DC
  Administered 2016-11-29 – 2016-11-30 (×2): 12.5 mg via ORAL
  Filled 2016-11-29 (×2): qty 1

## 2016-11-29 MED ORDER — ASPIRIN 81 MG PO CHEW
81.0000 mg | CHEWABLE_TABLET | Freq: Every day | ORAL | Status: DC
  Administered 2016-11-29 – 2016-11-30 (×2): 81 mg via ORAL
  Filled 2016-11-29 (×2): qty 1

## 2016-11-29 MED ORDER — ASPIRIN 81 MG PO CHEW: 324.0000 mg | CHEWABLE_TABLET | Freq: Once | ORAL | Status: DC

## 2016-11-29 MED ORDER — METOPROLOL TARTRATE 12.5 MG HALF TABLET
12.5000 mg | ORAL_TABLET | Freq: Two times a day (BID) | ORAL | Status: DC
  Administered 2016-11-29 – 2016-11-30 (×2): 12.5 mg via ORAL
  Filled 2016-11-29 (×2): qty 1

## 2016-11-29 MED ORDER — HEPARIN (PORCINE) IN NACL 100-0.45 UNIT/ML-% IJ SOLN
1300.0000 [IU]/h | INTRAMUSCULAR | Status: DC
  Administered 2016-11-29: 1300 [IU]/h via INTRAVENOUS
  Filled 2016-11-29: qty 250

## 2016-11-29 MED ORDER — IOPAMIDOL (ISOVUE-370) INJECTION 76%
INTRAVENOUS | Status: DC | PRN
  Administered 2016-11-29: 47 mL via INTRAVENOUS

## 2016-11-29 MED ORDER — HEPARIN (PORCINE) IN NACL 2-0.9 UNIT/ML-% IJ SOLN
INTRAMUSCULAR | Status: AC
  Filled 2016-11-29: qty 1000

## 2016-11-29 MED ORDER — LIDOCAINE HCL (PF) 1 % IJ SOLN
INTRAMUSCULAR | Status: DC | PRN
  Administered 2016-11-29: 15 mL

## 2016-11-29 MED ORDER — SODIUM CHLORIDE 0.9 % IV SOLN: 250.0000 mL | INTRAVENOUS | Status: DC | PRN

## 2016-11-29 MED ORDER — HEPARIN (PORCINE) IN NACL 2-0.9 UNIT/ML-% IJ SOLN
INTRAMUSCULAR | Status: DC | PRN
  Administered 2016-11-29: 12:00:00

## 2016-11-29 MED ORDER — FENTANYL CITRATE (PF) 100 MCG/2ML IJ SOLN
INTRAMUSCULAR | Status: AC
  Filled 2016-11-29: qty 2

## 2016-11-29 MED ORDER — MIDAZOLAM HCL 2 MG/2ML IJ SOLN
INTRAMUSCULAR | Status: DC | PRN
  Administered 2016-11-29: 1 mg via INTRAVENOUS

## 2016-11-29 MED ORDER — SACUBITRIL-VALSARTAN 24-26 MG PO TABS
1.0000 | ORAL_TABLET | Freq: Two times a day (BID) | ORAL | Status: DC
  Administered 2016-11-29 – 2016-11-30 (×2): 1 via ORAL
  Filled 2016-11-29 (×3): qty 1

## 2016-11-29 MED ORDER — SODIUM CHLORIDE 0.9 % IV SOLN: INTRAVENOUS | Status: AC

## 2016-11-29 MED ORDER — LIDOCAINE HCL 2 % IJ SOLN
INTRAMUSCULAR | Status: AC
  Filled 2016-11-29: qty 10

## 2016-11-29 MED ORDER — AMLODIPINE BESYLATE 5 MG PO TABS
5.0000 mg | ORAL_TABLET | Freq: Every day | ORAL | Status: DC
  Administered 2016-11-30: 5 mg via ORAL
  Filled 2016-11-29: qty 1

## 2016-11-29 MED ORDER — IOPAMIDOL (ISOVUE-370) INJECTION 76%
INTRAVENOUS | Status: AC
  Filled 2016-11-29: qty 100

## 2016-11-29 MED ORDER — HEPARIN SODIUM (PORCINE) 5000 UNIT/ML IJ SOLN
4000.0000 [IU] | Freq: Once | INTRAMUSCULAR | Status: AC
  Administered 2016-11-29: 4000 [IU] via INTRAVENOUS

## 2016-11-29 MED ORDER — NITROGLYCERIN IN D5W 200-5 MCG/ML-% IV SOLN
0.0000 ug/min | INTRAVENOUS | Status: DC
  Administered 2016-11-29: 10 ug/min via INTRAVENOUS
  Filled 2016-11-29: qty 250

## 2016-11-29 MED ORDER — MIDAZOLAM HCL 2 MG/2ML IJ SOLN
INTRAMUSCULAR | Status: AC
  Filled 2016-11-29: qty 2

## 2016-11-29 MED ORDER — MORPHINE SULFATE (PF) 4 MG/ML IV SOLN
INTRAVENOUS | Status: AC
  Administered 2016-11-29: 2 mg
  Filled 2016-11-29: qty 1

## 2016-11-29 MED ORDER — HYDRALAZINE HCL 50 MG PO TABS
50.0000 mg | ORAL_TABLET | Freq: Three times a day (TID) | ORAL | Status: DC
  Administered 2016-11-29 – 2016-11-30 (×3): 50 mg via ORAL
  Filled 2016-11-29 (×3): qty 1

## 2016-11-29 MED ORDER — POTASSIUM CHLORIDE CRYS ER 20 MEQ PO TBCR
20.0000 meq | EXTENDED_RELEASE_TABLET | Freq: Every day | ORAL | Status: DC
  Administered 2016-11-29 – 2016-11-30 (×2): 20 meq via ORAL
  Filled 2016-11-29: qty 1
  Filled 2016-11-29 (×3): qty 2

## 2016-11-29 MED ORDER — SODIUM CHLORIDE 0.9% FLUSH
3.0000 mL | Freq: Two times a day (BID) | INTRAVENOUS | Status: DC
  Administered 2016-11-29 – 2016-11-30 (×2): 3 mL via INTRAVENOUS

## 2016-11-29 MED ORDER — FENTANYL CITRATE (PF) 100 MCG/2ML IJ SOLN
INTRAMUSCULAR | Status: DC | PRN
  Administered 2016-11-29: 25 ug via INTRAVENOUS

## 2016-11-29 MED ORDER — FUROSEMIDE 40 MG PO TABS
40.0000 mg | ORAL_TABLET | Freq: Every day | ORAL | Status: DC
  Administered 2016-11-30: 40 mg via ORAL
  Filled 2016-11-29: qty 1

## 2016-11-29 SURGICAL SUPPLY — 8 items
CATH INFINITI 5FR MULTPACK ANG (CATHETERS) ×2 IMPLANT
HOVERMATT SINGLE USE (MISCELLANEOUS) ×2 IMPLANT
KIT HEART LEFT (KITS) ×2 IMPLANT
PACK CARDIAC CATHETERIZATION (CUSTOM PROCEDURE TRAY) ×2 IMPLANT
SHEATH PINNACLE 5F 10CM (SHEATH) ×2 IMPLANT
SYR 10CC CONTROL (SYRINGE) ×2 IMPLANT
TRANSDUCER W/STOPCOCK (MISCELLANEOUS) ×2 IMPLANT
WIRE EMERALD 3MM-J .035X150CM (WIRE) ×2 IMPLANT

## 2016-11-29 NOTE — Progress Notes (Signed)
   11/29/16 1100  Clinical Encounter Type  Visited With Patient and family together;Health care provider  Visit Type Initial  Referral From Nurse  Consult/Referral To Chaplain  Spiritual Encounters  Spiritual Needs Emotional;Prayer   Responded to a [facility] page.  Patient arrived and was assessed by the medical team.  Escorted family to see the patient and we had prayer together.  Patient taken to the Cath lab.  I got the family situated in the waiting room with a pager to be contacted following the procedure.  Will follow as needed. Chaplain Agustin Cree

## 2016-11-29 NOTE — ED Triage Notes (Signed)
PT arrives via [facility]. PT was running errands when he had sudden onset crushing chest pain over left side with diaphoresis. PT sat down and pain improved. EMS arrived. PT was given 324 ASA and 2 SL nitro by EMS consulted cone and stemi page sent out on arrival. PT alert and oriented. Pain 7/10

## 2016-11-29 NOTE — Progress Notes (Addendum)
Site area: RFA Site Prior to Removal:  Level 0 Pressure Applied For: 20 min Manual:  yes  Patient Status During Pull:  stable Post Pull Site:  Level 0 Post Pull Instructions Given: yes Post Pull Pulses Present: palpable Dressing Applied:  tegaderm Bedrest begins @ 1235 till 1635 Comments:

## 2016-11-29 NOTE — Progress Notes (Signed)
Report received from cath la, and pt arrived to [address] rm 22. Pt oriented to the unit and room, VSS, telemetry applied and verified; pt currently denies pain, pt skin intact with no pressure ulcer or wounds noted. Rt groin site looks soft , no bleeding noted. Pt resting comfortably in bed with call light within reach at bedside.   

## 2016-11-29 NOTE — H&P (Signed)
Referring Physician:  [name] is an 46 y.o. male.                       Chief Complaint: Chest pain  HPI: 46 year old male with dilated and non-ischemic cardiomyopathy had sudden onset of chest pain with diaphoresis while running errands. EKG shows ST elevation in V1 and subtle elevation in lead V2 with diffuse ST depression in inferior and lateral leads. Prior cardiac cath showed normal coronaries in 05/2015. Some of the chest pain is with deep breathing and palpation also.  Past Medical History:  Diagnosis Date  . Chronic systolic CHF (congestive heart failure) (HCC)    a. echo 3/17 - Moderate LVH, EF 25-30%, diffuse HK, mild MR, mild LAE  . Hypertension   . MS (multiple sclerosis) (HCC)   . Non-ischemic cardiomyopathy (HCC)    a. LHC 3/17 - normal cor arteries      Past Surgical History:  Procedure Laterality Date  . CARDIAC CATHETERIZATION N/A 06/01/2015   Procedure: Right/Left Heart Cath and Coronary Angiography;  Surgeon: Marykay Lex, MD;  Location: [facility] INVASIVE CV LAB;  Service: Cardiovascular;  Laterality: N/A;  . GALLBLADDER SURGERY     2009    Family History  Problem Relation Age of Onset  . High blood pressure Mother   . High blood pressure Father    Social History:  reports that he has been smoking Cigarettes.  He has a 10.00 pack-year smoking history. He has never used smokeless tobacco. He reports that he drinks alcohol. He reports that he does not use drugs.  Allergies: No Known Allergies   (Not in a hospital admission)  Results for orders placed or performed during the hospital encounter of 11/29/16 (from the past 48 hour(s))  I-stat chem 8, ed     Status: Abnormal   Collection Time: 11/29/16 10:06 AM  Result Value Ref Range   Sodium 140 135 - 145 mmol/L   Potassium 3.4 (L) 3.5 - 5.1 mmol/L   Chloride 104 101 - 111 mmol/L   BUN 15 6 - 20 mg/dL   Creatinine, Ser 2.39 0.61 - 1.24 mg/dL   Glucose, Bld 532 (H) 65 - 99 mg/dL   Calcium, Ion 0.23 (L)  1.15 - 1.40 mmol/L   TCO2 24 22 - 32 mmol/L   Hemoglobin 16.0 13.0 - 17.0 g/dL   HCT 34.3 56.8 - 61.6 %   No results found.  Review Of Systems Constitutional: No fever, chills, weight loss or gain. Eyes: No vision change, wears glasses. No discharge or pain. Ears: No hearing loss, No tinnitus. Respiratory: No asthma, COPD, pneumonias. Positive shortness of breath. No hemoptysis. Cardiovascular: Positive chest pain, palpitation, leg edema. Gastrointestinal: No nausea, vomiting, diarrhea, constipation. No GI bleed. No hepatitis. Genitourinary: No dysuria, hematuria, kidney stone. No incontinance. Neurological: No headache, stroke, seizures.  Psychiatry: No psych facility admission for anxiety, depression, suicide. No detox. Skin: No rash. Musculoskeletal: No joint pain, fibromyalgia. No neck pain, back pain. Lymphadenopathy: No lymphadenopathy. Hematology: No anemia or easy bruising.   Blood pressure (!) 164/90, pulse 91, temperature 98.6 F (37 C), temperature source Oral, resp. rate 16, weight 117.9 kg (260 lb), SpO2 100 %. Body mass index is 35.26 kg/m. General appearance: alert, cooperative, appears stated age and no distress Head: Normocephalic, atraumatic. Eyes: Brown eyes, pink conjunctiva, corneas clear. PERRL, EOM's intact. Neck: No adenopathy, no carotid bruit, no JVD, supple, symmetrical, trachea midline and thyroid not enlarged. Resp: Clear  to auscultation bilaterally. Cardio: Regular rate and rhythm, S1, S2 normal, II/VI systolic murmur, no click, rub or gallop GI: Soft, non-tender; bowel sounds normal; no organomegaly. Extremities: Trace edema, no cyanosis or clubbing. Skin: Warm and dry.  Neurologic: Alert and oriented X 3, normal strength. Normal coordination and gait.  Assessment/Plan Chest pain R/O MI Dilated cardiomyopathy Hypertension Multiple sclerosis CKD, II  Admit. Cardiac cath. Emergent echocardiogram. IV heparin.  Ricki Rodriguez,  MD  11/29/2016, 10:13 AM

## 2016-11-29 NOTE — Progress Notes (Signed)
Ref: Default, Provider, MD   Subjective:  Feeling better. Elevated blood pressure.  Objective:  Vital Signs in the last 24 hours: Temp:  [98.3 F (36.8 C)-98.6 F (37 C)] 98.3 F (36.8 C) (09/28 1515) Pulse Rate:  [62-123] 123 (09/28 1640) Cardiac Rhythm: Normal sinus rhythm (09/28 1500) Resp:  [6-16] 14 (09/28 1640) BP: (123-180)/(65-108) 174/107 (09/28 1640) SpO2:  [98 %-100 %] 99 % (09/28 1410) Weight:  [117.9 kg (260 lb)] 117.9 kg (260 lb) (09/28 0958)  Physical Exam: BP Readings from Last 1 Encounters:  11/29/16 (!) 174/107    Wt Readings from Last 1 Encounters:  11/29/16 117.9 kg (260 lb)    Weight change:  Body mass index is 35.26 kg/m. HEENT: [place]/AT, Eyes-Brown, PERL, EOMI, Conjunctiva-Pink, Sclera-Non-icteric Neck: No JVD, No bruit, Trachea midline. Lungs:  Clear, Bilateral. Cardiac:  Regular rhythm, normal S1 and S2, no S3. II/VI systolic murmur. Abdomen:  Soft, non-tender. BS present. Extremities:  Trace edema present. No cyanosis. No clubbing. CNS: AxOx3, Cranial nerves grossly intact, moves all 4 extremities.  Skin: Warm and dry.   Intake/Output from previous day: No intake/output data recorded.    Lab Results: BMET    Component Value Date/Time   NA 140 11/29/2016 1006   NA 136 11/29/2016 1000   NA 137 11/26/2015 0501   K 3.4 (L) 11/29/2016 1006   K 3.6 11/29/2016 1000   K 4.0 11/26/2015 0501   CL 104 11/29/2016 1006   CL 105 11/29/2016 1000   CL 104 11/26/2015 0501   CO2 19 (L) 11/29/2016 1000   CO2 26 11/26/2015 0501   CO2 30 11/25/2015 0231   GLUCOSE 108 (H) 11/29/2016 1006   GLUCOSE 107 (H) 11/29/2016 1000   GLUCOSE 100 (H) 11/26/2015 0501   BUN 15 11/29/2016 1006   BUN 13 11/29/2016 1000   BUN 17 11/26/2015 0501   CREATININE 1.20 11/29/2016 1006   CREATININE 1.20 11/29/2016 1000   CREATININE 1.43 (H) 11/26/2015 0501   CREATININE 1.30 06/26/2015 1527   CALCIUM 9.0 11/29/2016 1000   CALCIUM 9.0 11/26/2015 0501   CALCIUM 9.3  11/25/2015 0231   GFRNONAA >60 11/29/2016 1000   GFRNONAA 59 (L) 11/26/2015 0501   GFRNONAA 48 (L) 11/25/2015 0231   GFRAA >60 11/29/2016 1000   GFRAA >60 11/26/2015 0501   GFRAA 56 (L) 11/25/2015 0231   CBC    Component Value Date/Time   WBC 9.4 11/29/2016 1000   RBC 5.53 11/29/2016 1000   HGB 16.0 11/29/2016 1006   HCT 47.0 11/29/2016 1006   PLT 222 11/29/2016 1000   MCV 79.7 11/29/2016 1000   MCH 26.0 11/29/2016 1000   MCHC 32.7 11/29/2016 1000   RDW 14.1 11/29/2016 1000   LYMPHSABS 2.2 11/29/2016 1000   MONOABS 0.6 11/29/2016 1000   EOSABS 0.3 11/29/2016 1000   BASOSABS 0.0 11/29/2016 1000   HEPATIC Function Panel  Recent Labs  11/29/16 1000  PROT 7.3   HEMOGLOBIN A1C No components found for: HGA1C,  MPG CARDIAC ENZYMES Lab Results  Component Value Date   TROPONINI 0.05 (HH) 11/29/2016   TROPONINI 0.16 (HH) 11/23/2015   TROPONINI 0.19 (HH) 11/23/2015   BNP No results for input(s): PROBNP in the last 8760 hours. TSH No results for input(s): TSH in the last 8760 hours. CHOLESTEROL  Recent Labs  11/29/16 1000  CHOL 163    Scheduled Meds: . [START ON 11/30/2016] amLODipine  5 mg Oral Daily  . aspirin  81 mg Oral Daily  .  baclofen  10 mg Oral BID  . [START ON 11/30/2016] furosemide  40 mg Oral Daily  . hydrALAZINE  50 mg Oral TID  . [START ON 11/30/2016] loratadine  10 mg Oral Daily  . metoprolol tartrate  12.5 mg Oral BID  . potassium chloride  20 mEq Oral Daily  . sacubitril-valsartan  1 tablet Oral BID  . sodium chloride flush  3 mL Intravenous Q12H  . spironolactone  12.5 mg Oral BID   Continuous Infusions: . sodium chloride    . nitroGLYCERIN Stopped (11/29/16 1029)   PRN Meds:.sodium chloride, acetaminophen, ondansetron (ZOFRAN) IV, sodium chloride flush  Assessment/Plan: Chest pain Dilated non-ischemic cardiomyopathy Hypertension Multiple sclerosis CKD, II  Resume home medications. Possible discharge in AM.   LOS: 0 days    Orpah Cobb  MD  11/29/2016, 6:11 PM

## 2016-11-29 NOTE — ED Provider Notes (Signed)
[facility]-EMERGENCY DEPT Provider Note   CSN: [id number] Arrival date & time: 11/29/16  6283     History   Chief Complaint Chief Complaint  Patient presents with  . Code STEMI    HPI Blakelee Fulco is a 46 y.o. male.  46 year old male history of NICM, CHF (EF 25-30%), hypertension, multiple sclerosis, and noncompliance who presents via EMS as a code STEMI.  Patient endorses onset of left chest pain radiating to left arm approximately 40 minutes prior to arrival.  Pt received 4x ASA and nitroglycerin PTA. EKG showing ST elevation of AVR, V1, and V2 with diffuse ST depressions. Hypertensive to 200s by EMS. Patient's cardiologist is Dr. Algie Coffer.  Last catheterization in March 2017 showed clean coronaries and findings of diffuse hypokinesis.   The history is provided by the patient, the EMS personnel and medical records. No language interpreter was used.    Past Medical History:  Diagnosis Date  . Chronic systolic CHF (congestive heart failure) (HCC)    a. echo 3/17 - Moderate LVH, EF 25-30%, diffuse HK, mild MR, mild LAE  . Hypertension   . MS (multiple sclerosis) (HCC)   . Non-ischemic cardiomyopathy (HCC)    a. LHC 3/17 - normal cor arteries    Patient Active Problem List   Diagnosis Date Noted  . Precordial chest pain 11/29/2016  . Acute on chronic left systolic heart failure (HCC) 11/23/2015  . Noncompliance with medication treatment due to intermittent use of medication   . NICM (nonischemic cardiomyopathy) (HCC)   . CKD (chronic kidney disease) stage 3, GFR 30-59 ml/min 05/30/2015  . Chronic systolic CHF (congestive heart failure) (HCC) 05/30/2015  . Hypertensive heart disease   . MS (multiple sclerosis) (HCC)     Past Surgical History:  Procedure Laterality Date  . CARDIAC CATHETERIZATION N/A 06/01/2015   Procedure: Right/Left Heart Cath and Coronary Angiography;  Surgeon: Marykay Lex, MD;  Location: [facility] INVASIVE CV LAB;  Service: Cardiovascular;  Laterality: N/A;  .  GALLBLADDER SURGERY     2009  . LEFT HEART CATH AND CORONARY ANGIOGRAPHY N/A 11/29/2016   Procedure: LEFT HEART CATH AND CORONARY ANGIOGRAPHY;  Surgeon: Orpah Cobb, MD;  Location: [facility] INVASIVE CV LAB;  Service: Cardiovascular;  Laterality: N/A;       Home Medications    Prior to Admission medications   Medication Sig Start Date End Date Taking? Authorizing Provider  amLODipine (NORVASC) 5 MG tablet Take 1 tablet (5 mg total) by mouth daily. 11/27/15  Yes Orpah Cobb, MD  aspirin EC 81 MG EC tablet Take 1 tablet (81 mg total) by mouth daily. 06/02/15  Yes Dhungel, Nishant, MD  baclofen (LIORESAL) 10 MG tablet Take 10 mg by mouth 2 (two) times daily.   Yes [provider]  furosemide (LASIX) 40 MG tablet Take 1 tablet (40 mg total) by mouth daily. 11/26/15  Yes Orpah Cobb, MD  hydrALAZINE (APRESOLINE) 50 MG tablet Take 1 tablet (50 mg total) by mouth 3 (three) times daily. 06/26/15  Yes Weaver, Scott T, PA-C  loratadine (CLARITIN) 10 MG tablet Take 10 mg by mouth daily.   Yes [provider]  metoprolol tartrate (LOPRESSOR) 25 MG tablet Take 0.5 tablets (12.5 mg total) by mouth 2 (two) times daily. 11/26/15  Yes Orpah Cobb, MD  oxyCODONE-acetaminophen (PERCOCET) 10-325 MG tablet Take 1 tablet by mouth every 8 (eight) hours as needed for pain. 11/07/11  Yes [provider]  sacubitril-valsartan (ENTRESTO) 24-26 MG Take 1 tablet by mouth 2 (two)  times daily. 11/26/15  Yes Orpah Cobb, MD  spironolactone (ALDACTONE) 25 MG tablet Take 0.5 tablets (12.5 mg total) by mouth 2 (two) times daily. 11/26/15  Yes Orpah Cobb, MD    Family History Family History  Problem Relation Age of Onset  . High blood pressure Mother   . High blood pressure Father     Social History Social History  Substance Use Topics  . Smoking status: Current Every Day Smoker    Packs/day: 0.50    Years: 20.00    Types: Cigarettes  . Smokeless tobacco: Never Used  . Alcohol use 0.0  oz/week     Comment: Social     Allergies   Patient has no known allergies.   Review of Systems Review of Systems  Constitutional: Negative for chills and fever.  HENT: Negative for ear pain and sore throat.   Eyes: Negative for pain and visual disturbance.  Respiratory: Negative for cough and shortness of breath.   Cardiovascular: Positive for chest pain. Negative for palpitations.  Gastrointestinal: Negative for abdominal pain and vomiting.  Genitourinary: Negative for dysuria and hematuria.  Musculoskeletal: Negative for arthralgias and back pain.  Skin: Negative for color change and rash.  Neurological: Negative for seizures and syncope.  All other systems reviewed and are negative.    Physical Exam Updated Vital Signs BP (!) 160/86 (BP Location: Right Wrist)   Pulse 62   Temp 98.4 F (36.9 C) (Oral)   Resp 17   Ht 6' (1.829 m)   Wt 117.9 kg (260 lb)   SpO2 100%   BMI 35.26 kg/m   Physical Exam  Constitutional: He appears well-developed. He has a sickly appearance. He appears ill.  HENT:  Head: Normocephalic and atraumatic.  Eyes: Conjunctivae are normal.  Neck: Neck supple.  Cardiovascular: Normal rate and regular rhythm.   No murmur heard. Pulmonary/Chest: Effort normal and breath sounds normal. No respiratory distress.  Abdominal: Soft. There is no tenderness.  Musculoskeletal: He exhibits no edema.  Neurological: He is alert. No cranial nerve deficit. Coordination normal.  Moves all extremities  Skin: Skin is warm and dry.  Nursing note and vitals reviewed.    ED Treatments / Results  Labs (all labs ordered are listed, but only abnormal results are displayed) Labs Reviewed  COMPREHENSIVE METABOLIC PANEL - Abnormal; Notable for the following:       Result Value   CO2 19 (*)    Glucose, Bld 107 (*)    ALT 14 (*)    All other components within normal limits  TROPONIN I - Abnormal; Notable for the following:    Troponin I 0.05 (*)    All other  components within normal limits  LIPID PANEL - Abnormal; Notable for the following:    HDL 32 (*)    LDL Cholesterol 113 (*)    All other components within normal limits  I-STAT CHEM 8, ED - Abnormal; Notable for the following:    Potassium 3.4 (*)    Glucose, Bld 108 (*)    Calcium, Ion 1.01 (*)    All other components within normal limits  CBC WITH DIFFERENTIAL/PLATELET  PROTIME-INR  APTT  CBC  BASIC METABOLIC PANEL  I-STAT TROPONIN, ED  POCT ACTIVATED CLOTTING TIME    EKG  EKG Interpretation  Date/Time:  Friday November 29 2016 09:59:15 EDT Ventricular Rate:  91 PR Interval:    QRS Duration: 116 QT Interval:  381 QTC Calculation: 469 R Axis:   16 Text Interpretation:  Sinus rhythm Biatrial enlargement LVH with IVCD and secondary repol abnrm ST-t wave abnormality Abnormal ekg Confirmed by Gerhard Munch [phone]) on 11/29/2016 10:07:25 AM       Radiology Dg Chest Portable 1 View  Result Date: 11/29/2016 CLINICAL DATA:  Chest pain EXAM: PORTABLE CHEST 1 VIEW COMPARISON:  11/23/2015 FINDINGS: Cardiac shadow remains mildly enlarged. The lungs are well aerated bilaterally. No focal infiltrate or sizable effusion is seen. No bony abnormality is noted. IMPRESSION: No active disease. Electronically Signed   By: Alcide Clever M.D.   On: 11/29/2016 10:23    Procedures Procedures (including critical care time)  Medications Ordered in ED Medications  nitroGLYCERIN 50 mg in dextrose 5 % 250 mL (0.2 mg/mL) infusion ( Intravenous MAR Unhold 11/29/16 1446)  acetaminophen (TYLENOL) tablet 650 mg (not administered)  ondansetron (ZOFRAN) injection 4 mg (not administered)  0.9 %  sodium chloride infusion (400 mLs Intravenous Rate/Dose Change 11/29/16 1207)  sodium chloride flush (NS) 0.9 % injection 3 mL (3 mLs Intravenous Given 11/29/16 1658)  sodium chloride flush (NS) 0.9 % injection 3 mL (not administered)  0.9 %  sodium chloride infusion (not administered)  amLODipine (NORVASC)  tablet 5 mg (not administered)  aspirin chewable tablet 81 mg (81 mg Oral Given 11/29/16 1849)  baclofen (LIORESAL) tablet 10 mg (10 mg Oral Given 11/29/16 2135)  furosemide (LASIX) tablet 40 mg (not administered)  hydrALAZINE (APRESOLINE) tablet 50 mg (50 mg Oral Given 11/29/16 2116)  loratadine (CLARITIN) tablet 10 mg (not administered)  metoprolol tartrate (LOPRESSOR) tablet 12.5 mg (12.5 mg Oral Given 11/29/16 2108)  sacubitril-valsartan (ENTRESTO) 24-26 mg per tablet (1 tablet Oral Given 11/29/16 2116)  spironolactone (ALDACTONE) tablet 12.5 mg (12.5 mg Oral Given 11/29/16 1850)  potassium chloride (K-DUR) CR tablet 20 mEq (20 mEq Oral Given 11/29/16 2108)  heparin injection 4,000 Units (4,000 Units Intravenous Given 11/29/16 1000)  morphine 4 MG/ML injection (2 mg  Given 11/29/16 1032)     Initial Impression / Assessment and Plan / ED Course  I have reviewed the triage vital signs and the nursing notes.  Pertinent labs & imaging results that were available during my care of the patient were reviewed by me and considered in my medical decision making (see chart for details).     81 yoM h/o NICM and HTN who presents via EMS as code STEMI. EKG showing ST elevation of AVR, V1, and V2 with diffuse ST depressions. Hypertensive to 200s by EMS. 160s/90s on arrival. Lungs CTAB, no appreciable crackles. Peripheral edema noted.  Heparin given. Repeat EKG showing similar findings. Cath lab coordinator present. Dr. Algie Coffer arrived at bedside to evaluate pt. Initial troponin 0.06, though concern for delayed elevation.  Pt taken emergently to cath lab. Pt stable at time of transfer.  Pt care d/w Dr. Jeraldine Loots  Final Clinical Impressions(s) / ED Diagnoses   Final diagnoses:  ST elevation  Diffuse ST segment depression  Hypertensive heart disease, unspecified whether heart failure present    New Prescriptions Current Discharge Medication List       Hebert Soho, MD 11/30/16 9604    Gerhard Munch, MD 11/30/16 [phone]

## 2016-11-29 NOTE — Progress Notes (Signed)
ANTICOAGULATION CONSULT NOTE - Initial Consult  Pharmacy Consult for heparin Indication: chest pain/ACS  No Known Allergies  Patient Measurements: Height: 6' (182.9 cm) Weight: 260 lb (117.9 kg) IBW/kg (Calculated) : 77.6 Heparin Dosing Weight: 103kg  Vital Signs: Temp: 98.6 F (37 C) (09/28 1010) Temp Source: Oral (09/28 1010) BP: 164/90 (09/28 1010) Pulse Rate: 91 (09/28 1010)  Labs:  Recent Labs  11/29/16 1006  HGB 16.0  HCT 47.0  CREATININE 1.20    Estimated Creatinine Clearance: 104.1 mL/min (by C-G formula based on SCr of 1.2 mg/dL).   Medical History: Past Medical History:  Diagnosis Date  . Chronic systolic CHF (congestive heart failure) (HCC)    a. echo 3/17 - Moderate LVH, EF 25-30%, diffuse HK, mild MR, mild LAE  . Hypertension   . MS (multiple sclerosis) (HCC)   . Non-ischemic cardiomyopathy (HCC)    a. LHC 3/17 - normal cor arteries    Medications:  Infusions:  . heparin    . nitroGLYCERIN Stopped (11/29/16 1029)    Assessment: 45 yom presented to the ED as a Code STEMI. To start IV heparin. Already received a  4000 unit bolus and now continuing with an infusion while cath plans are being determine. Baseline H/H is WNL and he is not on anticoagulation PTA.   Goal of Therapy:  Heparin level 0.3-0.7 units/ml Monitor platelets by anticoagulation protocol: Yes   Plan:  Heparin gtt 1300 units/hr Check a 6 hr heparin level Daily heparin level and CBC  Lakita Sahlin, Drake Leach 11/29/2016,10:36 AM

## 2016-11-29 NOTE — Progress Notes (Signed)
  Echocardiogram 2D Echocardiogram has been performed.  [name] 11/29/2016, 10:50 AM

## 2016-11-29 NOTE — ED Notes (Signed)
2L O2 placed on PT at this time.

## 2016-11-30 LAB — CBC
HEMATOCRIT: 45.5 % (ref 39.0–52.0)
Hemoglobin: 14.9 g/dL (ref 13.0–17.0)
MCH: 26.2 pg (ref 26.0–34.0)
MCHC: 32.7 g/dL (ref 30.0–36.0)
MCV: 80.1 fL (ref 78.0–100.0)
Platelets: 206 10*3/uL (ref 150–400)
RBC: 5.68 MIL/uL (ref 4.22–5.81)
RDW: 14.6 % (ref 11.5–15.5)
WBC: 10.1 10*3/uL (ref 4.0–10.5)

## 2016-11-30 LAB — BASIC METABOLIC PANEL
Anion gap: 7 (ref 5–15)
BUN: 14 mg/dL (ref 6–20)
CHLORIDE: 105 mmol/L (ref 101–111)
CO2: 25 mmol/L (ref 22–32)
CREATININE: 1.14 mg/dL (ref 0.61–1.24)
Calcium: 8.9 mg/dL (ref 8.9–10.3)
GFR calc non Af Amer: >60 mL/min (ref >60)
GLUCOSE: 105 mg/dL — AB (ref 65–99)
Potassium: 4.1 mmol/L (ref 3.5–5.1)
Sodium: 137 mmol/L (ref 135–145)

## 2016-11-30 MED ORDER — SACUBITRIL-VALSARTAN 49-51 MG PO TABS: 1.0000 | ORAL_TABLET | Freq: Two times a day (BID) | ORAL | 3 refills | Status: AC

## 2016-11-30 MED ORDER — SPIRONOLACTONE 25 MG PO TABS: 25.0000 mg | ORAL_TABLET | Freq: Once | ORAL | 3 refills | Status: AC

## 2016-11-30 MED ORDER — SACUBITRIL-VALSARTAN 49-51 MG PO TABS
1.0000 | ORAL_TABLET | Freq: Two times a day (BID) | ORAL | Status: DC
  Filled 2016-11-30: qty 1

## 2016-11-30 MED ORDER — OXYCODONE-ACETAMINOPHEN 5-325 MG PO TABS
1.0000 | ORAL_TABLET | Freq: Once | ORAL | Status: AC
  Administered 2016-11-30: 1 via ORAL
  Filled 2016-11-30: qty 1

## 2016-11-30 MED ORDER — METOPROLOL TARTRATE 25 MG PO TABS: 25.0000 mg | ORAL_TABLET | Freq: Two times a day (BID) | ORAL | 3 refills | Status: AC

## 2016-11-30 NOTE — Discharge Instructions (Signed)
Angiogram  An angiogram is an X-ray test. It is used to look at your blood vessels. For this test, a dye is put into the blood vessel being checked. The dye shows up on X-rays. It helps your doctor see if there is a blockage or other problem in the blood vessel.  What happens before the procedure?   Follow your doctor's instructions about limiting what you eat or drink.   Ask your doctor if you may drink enough water to take any needed medicines the morning of the test.   Plan to have someone take you home after the test.   If you go home the same day as the test, plan to have someone stay with you for 24 hours.  What happens during the procedure?   An IV tube will be put into one of your veins.   You will be given a medicine that makes you relax (sedative).   Your skin will be washed where the thin tube (catheter) will be put in. Hair may be removed from this area. The tube may be put into:  ? Your upper leg area (groin).  ? The fold of your arm, near your elbow.  ? Your wrist.   You will be given a medicine that numbs the area where the tube will be inserted (local anesthetic).   The tube will be inserted into a blood vessel.   Using a type of X-ray (fluoroscopy) to see, your doctor will move the tube into the blood vessel to check it.   Dye will be put in through the tube. X-rays of your blood vessels will then be taken.  Different doctors and hospitals may do this procedure differently.  What happens after the procedure?   If the test is done through the leg, you will be kept in bed lying flat for several hours. You will be told to not bend or cross your legs.   The area where the tube was inserted will be checked often.   The pulse in your feet or wrist will be checked often.   More tests or X-rays may be done.  This information is not intended to replace advice given to you by your health care provider. Make sure you discuss any questions you have with your health care provider.  Document  Released: 05/17/2008 Document Revised: 07/27/2015 Document Reviewed: 07/22/2012  Elsevier Interactive Patient Education  2017 [employer]

## 2016-11-30 NOTE — Discharge Summary (Signed)
Discharge summary dictated on 11/30/2016 dictation number is [phone]

## 2016-11-30 NOTE — Progress Notes (Signed)
Discharged to home with family office visits in place teaching done  

## 2016-12-02 MED FILL — Lidocaine HCl Local Inj 2%: INTRAMUSCULAR | Qty: 10 | Status: AC

## 2016-12-02 NOTE — Discharge Summary (Signed)
[name], [name] NO.:  [id number]  MEDICAL RECORD NO.:  [id number]  LOCATION:                                 FACILITY:  PHYSICIAN:  Tessla Spurling N. Sharyn Lull, M.D.      DATE OF BIRTH:  DATE OF ADMISSION:  11/29/2016 DATE OF DISCHARGE:  11/30/2016                              DISCHARGE SUMMARY   ADMITTING DIAGNOSES: 1. Chest pain, rule out myocardial infarction. 2. Dilated cardiomyopathy. 3. Hypertension. 4. Multiple sclerosis. 5. Chronic kidney disease stage 2.  FINAL DIAGNOSES: 1. Status post chest pain, myocardial infarction ruled out. 2. Nonischemic dilated cardiomyopathy. 3. Hypertension. 4. Glucose intolerance. 5. Hyperlipidemia, controlled by diet. 6. Multiple sclerosis. 7. Chronic kidney disease stage 2, improved.  DISCHARGE MEDICATIONS: 1. Entresto 49/51 mg one tablet twice daily. 2. Amlodipine 5 mg daily. 3. Aspirin 81 mg daily. 4. Baclofen 10 mg twice daily as needed as before. 5. Lasix 40 mg daily. 6. Claritin 10 mg as needed. 7. Percocet 10/325 as needed as before. 8. Metoprolol 25 mg one tablet twice daily. 9. Spironolactone 25 mg daily. The patient has been advised to stop hydralazine.  Entresto dose has been increased as above.  The patient has been advised to monitor blood pressure regularly and chart.  DIET:  Low-salt low-cholesterol 1800 calories.  ADA diet.  CONDITION AT DISCHARGE:  Stable.  Post cardiac cath instructions have been given.  FOLLOWUP:  Follow up with Dr. Algie Coffer in 1 week.  CONDITION AT DISCHARGE:  Stable.  BRIEF HISTORY AND HOSPITAL COURSE:  [name] is a 46 year old male with dilated nonischemic cardiomyopathy, hypertension, multiple sclerosis, came to ER because of sudden onset of chest pain with diaphoresis while running errands.  EKG showed ST-elevation in V1 and subtle elevation in V2 with diffuse ST depression in inferior and lateral leads.  The patient had prior catheterization done in March 2017,  which showed normal coronaries.  The patient states chest pain also increases with deep breathing and palpitations.  PHYSICAL EXAMINATION:  GENERAL:  On examination, he was alert, awake, oriented x3, in no acute distress. VITAL SIGNS:  Blood pressure was 164/90, pulse 91, he was afebrile. EYES:  Conjunctivae were pink. NECK:  Supple.  No JVD.  No bruit. LUNGS:  Clear to auscultation without rhonchi or rales. CARDIOVASCULAR:  S1, S2 was normal.  There was 2/6 systolic murmur.  No click, rub, or gallop. ABDOMEN:  Soft and nontender. EXTREMITIES:  Trace edema.  No clubbing or cyanosis.  LABORATORY DATA:  Sodium was 136, potassium 3.6, glucose was slightly elevated 107, BUN 13, and creatinine 1.20.  Troponin I was point of care 0.05.  Repeat troponin I was negative, by the lab 0.06.  His cholesterol was 163, LDL 113, and HDL 32.  Hemoglobin was 14.4, hematocrit 44.1, and white count of 9.4.  Chest x-ray showed no active disease.  BRIEF HOSPITAL COURSE:  Due to typical anginal chest pain and also some atypical features and slight new EKG changes, the patient underwent left cardiac catheterization by Dr. Algie Coffer yesterday and was noted to have patent coronaries.  The patient also had 2D echo done in the ED, which showed diffuse hypokinesia, EF has  improved to 40 to 45%.  The patient did not had any further episodes of chest pain during the hospital stay. His groin is stable.  The patient is ambulating in room without any problems.  The patient will be discharged home on above medications and will be followed up by Dr. Algie Coffer in 1 week.  Post cardiac cath instructions have been given.  The patient has been advised regarding lifestyle changes, compliance with medication, diet, etc.     Jermeka Schlotterbeck N. Sharyn Lull, M.D.     [facility]  D:  11/30/2016  T:  11/30/2016  Job:  [id number]  cc:   Eduardo Osier. Sharyn Lull, M.D.

## 2018-07-08 IMAGING — DX DG CHEST 1V PORT
2 series · 2 of 2 positions shown · non-contrast
Comparison: 11/23/2015

CLINICAL DATA: Chest pain

EXAM:
PORTABLE CHEST 1 VIEW

[chest ap (1 of 2)]
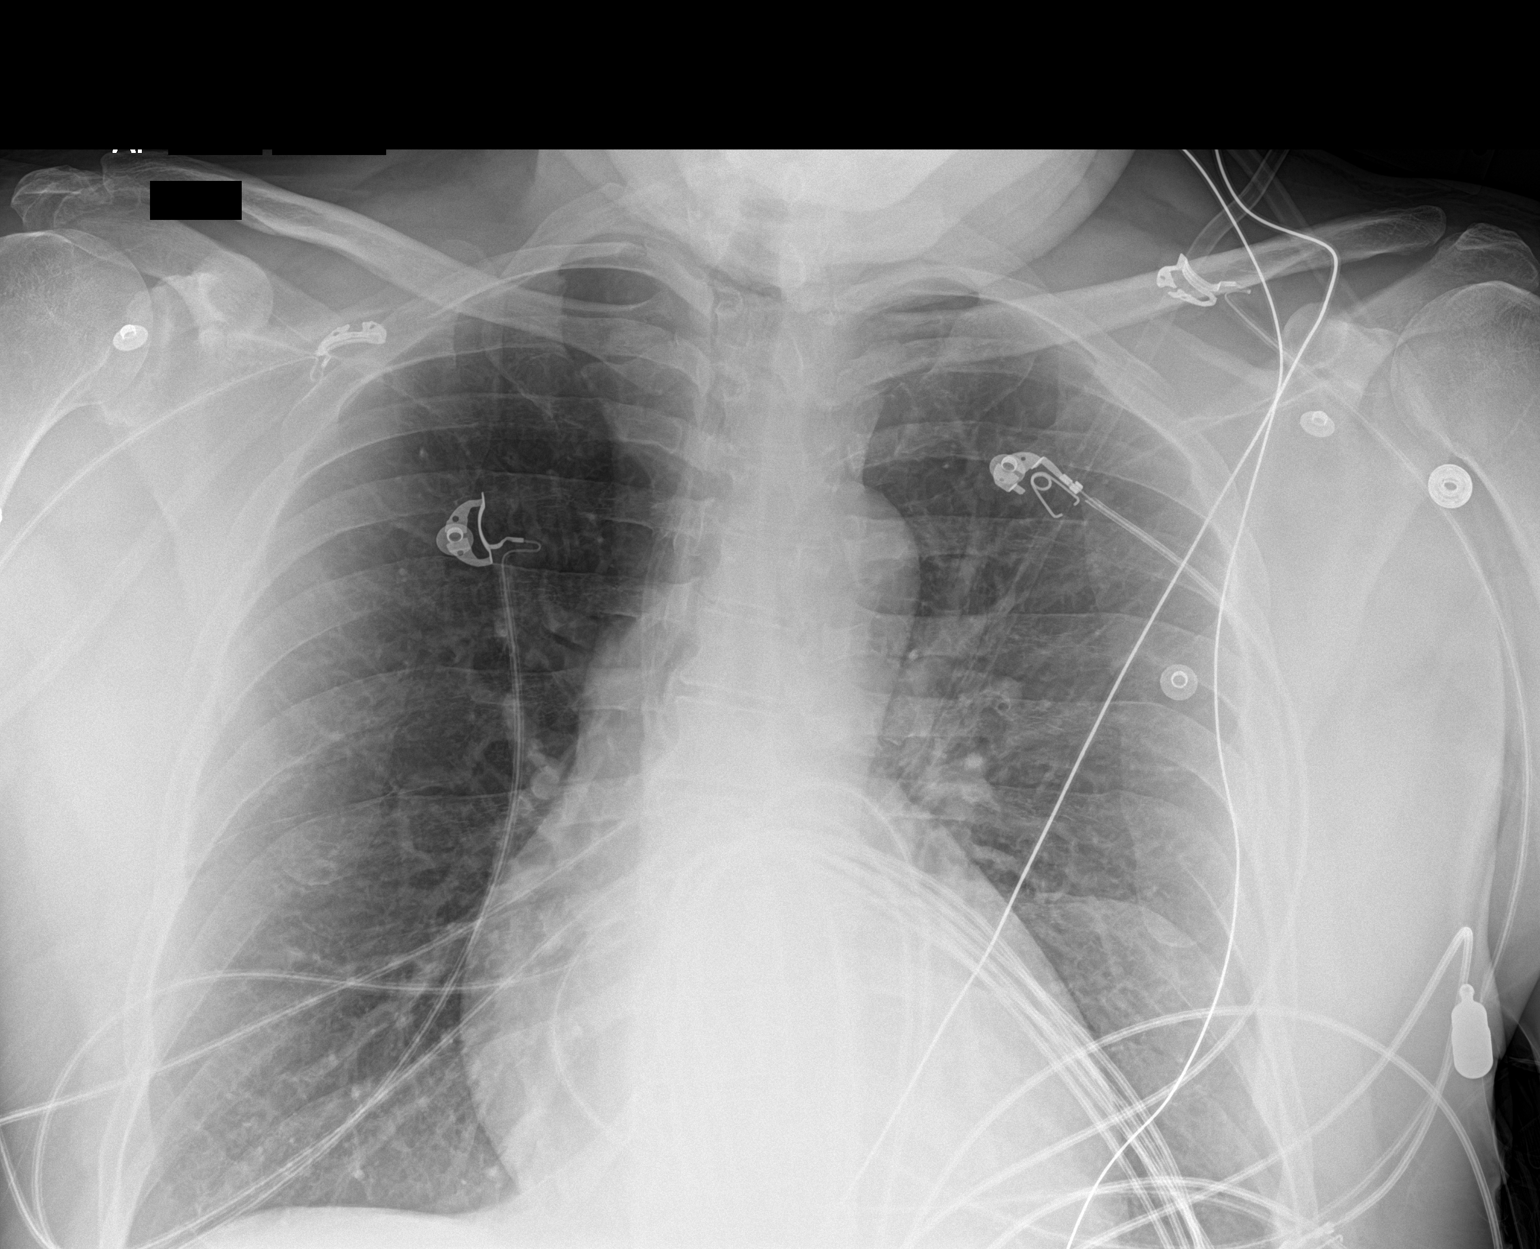

[chest ap (2 of 2)]
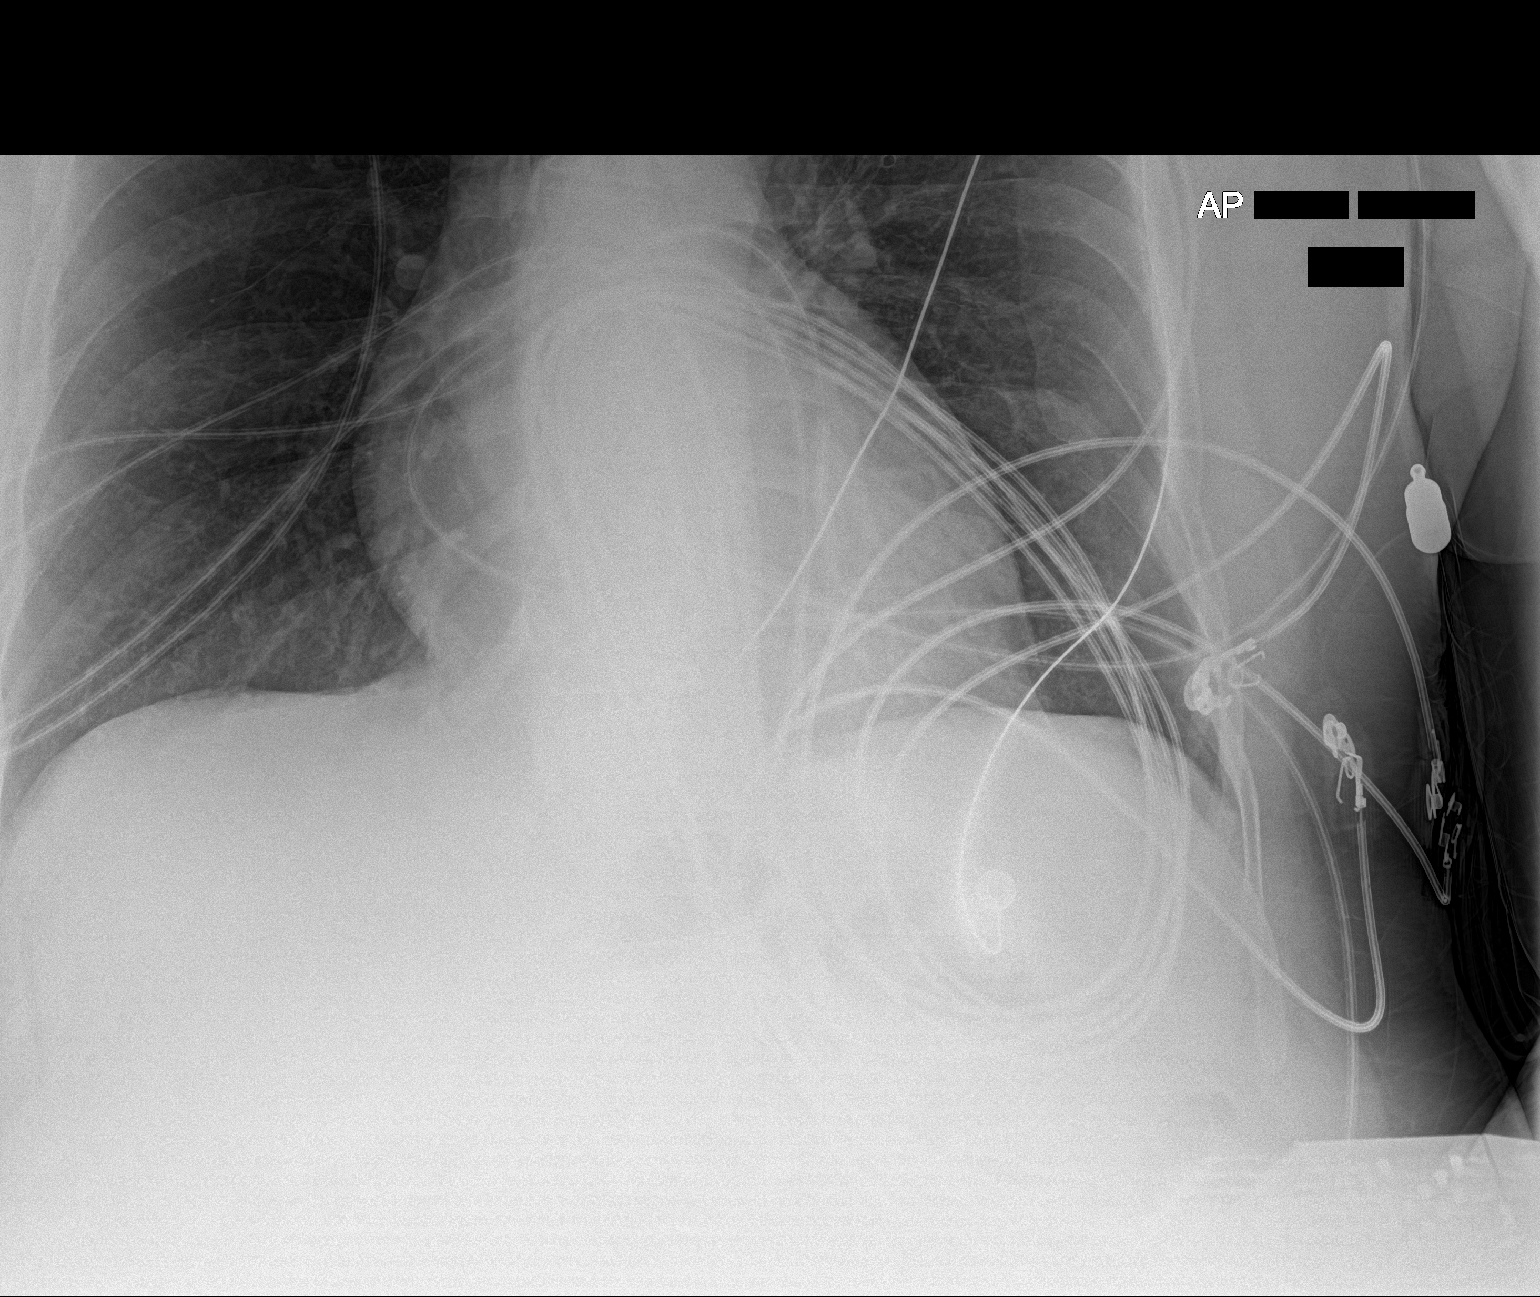

[2 of 2 positions shown; findings below may reference images not displayed]

FINDINGS: Cardiac shadow remains mildly enlarged. The lungs are well aerated
bilaterally. No focal infiltrate or sizable effusion is seen. No
bony abnormality is noted.
IMPRESSION: No active disease.

## 2019-02-10 DIAGNOSIS — R0789 Other chest pain: Secondary | ICD-10-CM

## 2019-02-10 DIAGNOSIS — R778 Other specified abnormalities of plasma proteins: Secondary | ICD-10-CM

## 2019-02-10 DIAGNOSIS — I5032 Chronic diastolic (congestive) heart failure: Secondary | ICD-10-CM

## 2019-02-10 DIAGNOSIS — I361 Nonrheumatic tricuspid (valve) insufficiency: Secondary | ICD-10-CM

## 2019-02-10 DIAGNOSIS — F1490 Cocaine use, unspecified, uncomplicated: Secondary | ICD-10-CM

## 2019-02-10 DIAGNOSIS — G4733 Obstructive sleep apnea (adult) (pediatric): Secondary | ICD-10-CM

## 2019-02-10 DIAGNOSIS — I340 Nonrheumatic mitral (valve) insufficiency: Secondary | ICD-10-CM

## 2019-02-10 DIAGNOSIS — G35 Multiple sclerosis: Secondary | ICD-10-CM

## 2019-02-10 DIAGNOSIS — I509 Heart failure, unspecified: Secondary | ICD-10-CM

## 2019-02-10 DIAGNOSIS — I160 Hypertensive urgency: Secondary | ICD-10-CM
